# Patient Record
Sex: Male | Born: 1955 | Race: Black or African American | Hispanic: No | Marital: Single | Smoking: Never smoker
Health system: Southern US, Community
[De-identification: ages and names within clinical notes are randomized; demographics above are authoritative.]

## PROBLEM LIST (undated history)

## (undated) DIAGNOSIS — E785 Hyperlipidemia, unspecified: Secondary | ICD-10-CM

## (undated) DIAGNOSIS — E119 Type 2 diabetes mellitus without complications: Secondary | ICD-10-CM

## (undated) DIAGNOSIS — K219 Gastro-esophageal reflux disease without esophagitis: Secondary | ICD-10-CM

## (undated) DIAGNOSIS — M109 Gout, unspecified: Secondary | ICD-10-CM

## (undated) DIAGNOSIS — I10 Essential (primary) hypertension: Secondary | ICD-10-CM

## (undated) DIAGNOSIS — E669 Obesity, unspecified: Secondary | ICD-10-CM

---

## 2003-12-26 ENCOUNTER — Emergency Department (HOSPITAL_COMMUNITY): Admission: EM | Admit: 2003-12-26 | Discharge: 2003-12-26 | Payer: Self-pay | Admitting: Family Medicine

## 2005-11-29 ENCOUNTER — Emergency Department (HOSPITAL_COMMUNITY): Admission: EM | Admit: 2005-11-29 | Discharge: 2005-11-29 | Payer: Self-pay | Admitting: Emergency Medicine

## 2005-11-30 ENCOUNTER — Inpatient Hospital Stay (HOSPITAL_COMMUNITY): Admission: EM | Admit: 2005-11-30 | Discharge: 2005-12-01 | Payer: Self-pay | Admitting: Internal Medicine

## 2005-12-22 ENCOUNTER — Encounter (HOSPITAL_COMMUNITY): Admission: RE | Admit: 2005-12-22 | Discharge: 2006-03-22 | Payer: Self-pay | Admitting: *Deleted

## 2006-03-23 ENCOUNTER — Encounter (HOSPITAL_COMMUNITY): Admission: RE | Admit: 2006-03-23 | Discharge: 2006-06-21 | Payer: Self-pay | Admitting: *Deleted

## 2006-08-19 ENCOUNTER — Emergency Department (HOSPITAL_COMMUNITY): Admission: EM | Admit: 2006-08-19 | Discharge: 2006-08-19 | Payer: Self-pay | Admitting: Emergency Medicine

## 2006-09-08 ENCOUNTER — Emergency Department (HOSPITAL_COMMUNITY): Admission: EM | Admit: 2006-09-08 | Discharge: 2006-09-08 | Payer: Self-pay | Admitting: Emergency Medicine

## 2007-10-12 ENCOUNTER — Emergency Department (HOSPITAL_COMMUNITY): Admission: EM | Admit: 2007-10-12 | Discharge: 2007-10-12 | Payer: Self-pay | Admitting: Emergency Medicine

## 2008-11-19 ENCOUNTER — Emergency Department (HOSPITAL_COMMUNITY): Admission: EM | Admit: 2008-11-19 | Discharge: 2008-11-19 | Payer: Self-pay | Admitting: Emergency Medicine

## 2010-02-09 ENCOUNTER — Emergency Department (HOSPITAL_COMMUNITY): Admission: EM | Admit: 2010-02-09 | Discharge: 2010-02-09 | Payer: Self-pay | Admitting: Family Medicine

## 2011-01-19 LAB — DIFFERENTIAL
Basophils Absolute: 0 10*3/uL (ref 0.0–0.1)
Basophils Relative: 0 % (ref 0–1)
Eosinophils Absolute: 0.1 10*3/uL (ref 0.0–0.7)
Monocytes Relative: 6 % (ref 3–12)
Neutro Abs: 6.8 10*3/uL (ref 1.7–7.7)
Neutrophils Relative %: 71 % (ref 43–77)

## 2011-01-19 LAB — POCT I-STAT, CHEM 8
BUN: 13 mg/dL (ref 6–23)
Calcium, Ion: 1.23 mmol/L (ref 1.12–1.32)
Creatinine, Ser: 1.3 mg/dL (ref 0.4–1.5)
Hemoglobin: 13.9 g/dL (ref 13.0–17.0)
Sodium: 140 mEq/L (ref 135–145)
TCO2: 30 mmol/L (ref 0–100)

## 2011-01-19 LAB — CBC
MCHC: 33.2 g/dL (ref 30.0–36.0)
MCV: 93.4 fL (ref 78.0–100.0)
Platelets: 242 10*3/uL (ref 150–400)
RBC: 4.13 MIL/uL — ABNORMAL LOW (ref 4.22–5.81)

## 2011-01-19 LAB — URIC ACID: Uric Acid, Serum: 7.5 mg/dL (ref 4.0–7.8)

## 2011-03-18 NOTE — Cardiovascular Report (Signed)
NAME:  Wayne Wood, MOLDEN NO.:  [id number]   MEDICAL RECORD NO.:  [id number]          PATIENT TYPE:  INP   LOCATION:  3799                         FACILITY:  [facility]   PHYSICIAN:  Vesta Mixer, M.D. DATE OF BIRTH:  [birth year]   DATE OF PROCEDURE:  11/30/2005  DATE OF DISCHARGE:                              CARDIAC CATHETERIZATION   [name] is a 55 year old gentleman who presented with episodes of chest  pain. Dr. Reyes Ivan performed a diagnostic heart catheterization this morning  and found him to have a 95% stenosis of his mid right coronary artery. He  also has a moderate stenosis in the left anterior descending artery after  the insertion of a saphenous vein graft. He is referred now for PTCA and  stenting of the mid right coronary artery.   The patient received 5,200 units of heparin followed by an additional 2,000  units of heparin. He also received a double-bolus Integrilin drip. The right  coronary artery was cannulated using a Judkins right 4 side hole guide (6  [place]). The starting ACT was 234.   The right coronary artery was wired using a BMW guide wire. A 2.5 mm x 12 mm  Quantum Maverick was positioned down in the right coronary artery mid  lesion. It was inflated up to 6 atmospheres for 20 seconds. This resulted in  improvement of the vessel lumen, and there is a residual stenosis of  approximately 50%.   At this point a 2.75 x 16 mm Taxus stent was positioned in the mid RCA  lesion. It was deployed at 16 atmospheres for 40 seconds. Post-stent  dilatation was achieved using a 3.25 x 12 mm Quantum Maverick. We positioned  the balloon in the middle of the stent and inflated it up to 12 atmospheres  for 25 seconds. It was then placed distally and inflated up to 12  atmospheres for 25 seconds. It was then pulled back more proximally and  inflated up to 14 atmospheres for 25 seconds. This resulted in a very nice  angiographic result. The 95% stenosis had  reduced down to 0% stenosis. He  had no evidence of edge dissection. The patient is stable leaving the cath  lab. He will need to remain on Plavix for least a year or two. He has also  been advised to stop smoking. He will follow up with Dr. Reyes Ivan.           ______________________________  Vesta Mixer, M.D.     PJN/MEDQ  D:  11/30/2005  T:  11/30/2005  Job:  [id number]

## 2011-03-18 NOTE — H&P (Signed)
NAME:  Wayne Wood, Wayne Wood NO.:  [id number]   MEDICAL RECORD NO.:  [id number]          PATIENT TYPE:  EMS   LOCATION:  ED                           FACILITY:  [facility]   PHYSICIAN:  Hettie Holstein, D.O.    DATE OF BIRTH:  1956/01/11   DATE OF ADMISSION:  11/29/2005  DATE OF DISCHARGE:                                HISTORY & PHYSICAL   PRIMARY CARE PHYSICIAN:  Unassigned.  His primary health care is coordinated  at the [facility].   CHIEF COMPLAINT:  Chest pain and nausea.   HISTORY OF PRESENT ILLNESS:  Mr. Wayne Wood is a very pleasant 55 year old  African-American male with known history of coronary artery disease.  He had  an MI in 2001 with two-vessel coronary artery bypass grafting performed at  [place].  In any event, he had been in his usual state of health up  to about 1 to 2 weeks ago when he began to develop shortness of breath with  exertion that has progressed.  Today he said this was profound.  He  typically walks about a mild and 1/2 daily.  Today he had to stop about 3  times with severe shortness of breath and tightness in his chest.   He is a one-pack-per-day smoker.  He has a history of substance abuse with  narcotics but has been abstinent for 3 years now, attending narcotics  anonymous regularly.  In any event, in the emergency department, his point-  of-care markers were negative.  EKG was negative as well, and his pain was  improved.   Contacted cardiologist here to assess him during this hospitalization and  generally will need a further risk evaluation if he rules out as symptoms  sound concerning for unstable angina.   PAST MEDICAL HISTORY:  As noted above and significant for:  1.  Coronary artery disease status post coronary artery bypass grafting x2      vessels at [place].  2.  History of mid epigastric hernia and repair in the 1980.  3.  He had a colonoscopy this past Christmas and was supposed to follow up      for an  EGD following this but has not had this performed.  This was an      evaluation for previous history of occult blood in his stool.  He states      he has not had any melena, coffee ground emesis, or bright red blood per      rectum.   MEDICATIONS:  1.  Omeprazole 20 mg daily.  2.  Aspirin 81 mg daily.  3.  Multivitamins daily.  4.  Metoprolol 25 mg twice daily.  5.  Simvastatin 40 mg daily.  6.  Lisinopril 10 mg daily.  7.  Flunisolide nasal solution 2 sprays each nostril twice daily.  8.  Nitro-Dur 0.4 mg patch daily, off at bedtime.  9.  Nitroglycerin tablets p.r.n.   ALLERGIES:  No known drug allergies.   SOCIAL HISTORY:  The patient is a one-pack-per-day smoker.  No alcohol, no  drugs.  He  has remained abstinent for 3 years.  He lives alone and is not  married.  He has children in [place], [place] as well as [place].  He is a retired Cytogeneticist.   FAMILY HISTORY:  His mother is alive and well at age 37 with  hypercholesterolemia.  His father is in his 63s with diabetes mellitus.  He  currently is on hemodialysis.   REVIEW OF SYSTEMS:  The patient reports dyspnea on exertion.  He reports  some early satiety, but he denies any weight loss.  No nausea, vomiting,  diarrhea, or blood in stool.  Further Review of Systems unremarkable.   PHYSICAL EXAMINATION:  VITAL SIGNS:  Stable.  Blood pressure 118/76,  temperature 98.2, heart rate 67, respirations 16, O2 saturation 98%.  GENERAL:  The patient is alert.  His speech is a bit difficult to follow,  but he does not exhibit any slurred speech.  HEENT:  Head is normocephalic and atraumatic.  Extraocular muscles intact.  NECK: Supple. Nontender.  No thyromegaly or mass.  CARDIOVASCULAR: Normal S1 and S2 without S3 or S4.  LUNGS: Clear with some scattered wheezes throughout.  He exhibits normal  effort.  There is no dullness to percussion.  ABDOMEN: Slightly distended.  Bowel sounds are normoactive.  There is no  rigidity,  rebound, guarding, or hepatosplenomegaly.  EXTREMITIES:  No edema. Peripheral pulses are symmetrical bilaterally.  NEUROLOGIC:  Reveals him to be euthymic.  His affect is stable.  There are  no focal neurologic deficits on examination.   LABORATORY DATA:  Sodium 138, potassium 4.4, BUN 8, creatinine 1.4, glucose  124, AST 21, ALT 17, albumin 3.4.  INR 1.  WBC 10.7, hemoglobin 13, platelet  count 258, MCV 94.   EKG reveals normal sinus rhythm with first degree A-V block, septal wall  infarct, age indeterminate.   BNP 56.  INR 1.  Point-of-care was negative.   Chest x-ray was unremarkable.   ASSESSMENT:  1.  Chest pain with known history of coronary artery disease.  2.  Suspect unstable angina.  3.  Hypertension.  4.  Tobacco dependence.  5.  Renal insufficiency, chronic.   PLAN:  At this time, we are going to admit Mr. Wayne Wood to a telemetry floor.  We have consulted Dr. Reyes Ivan to follow. Will cycle cardiac markers, initiate  low-molecular-weight heparin.  Will follow his clinical course.     Hettie Holstein, D.O.  Electronically Signed    ESS/MEDQ  D:  11/29/2005  T:  11/29/2005  Job:  [id number]   cc:   Renne Musca

## 2011-03-18 NOTE — Discharge Summary (Signed)
NAME:  Wayne Wood, Wayne Wood NO.:  [id number]   MEDICAL RECORD NO.:  [id number]          PATIENT TYPE:  INP   LOCATION:  6529                         FACILITY:  [facility]   PHYSICIAN:  Elmore Guise., M.D.DATE OF BIRTH:  October 16, 1956   DATE OF ADMISSION:  11/30/2005  DATE OF DISCHARGE:                                 DISCHARGE SUMMARY   DISCHARGE DIAGNOSES:  1.  Non-ST-elevation myocardial infarction status post percutaneous coronary      intervention of mid right coronary artery disease.  2.  History of coronary artery disease, status post sequential vein grafting      to the left anterior descending and diagonal back in 2001.  3.  Dyslipidemia.  4.  Hypertension.  5.  Tobacco dependence.   HISTORY OF PRESENT ILLNESS:  The patient is very pleasant 55 year old  African-American male admitted with non-ST elevation myocardial infarction.  He underwent cardiac catheterization on November 30, 2005.  His diagnostic  catheterization showed patent vein graft to his LAD and diagonal with 40-50%  mid LAD disease after that touchdown.  His circumflex had mid 50% prior to  bifurcation of two large OM vessels.  His culprit vessel was his mid RCA  which had 95-99% mid occlusion. Following the diagnostic catheterization, he  underwent successful PCI of his mid RCA without complication. His LV  function was normal and LVEDP was 19 mmHg. His peak troponin was 3.4 during  his hospitalization.  His renal function and hemoglobin remained stable. His  cholesterol showed an LDL of 55 and HDL of 33. Total cholesterol was 115. He  has done well post procedure.  He has been up and ambulatory, has talked to  the smoking cessation counselor as well as cardiac rehab. He will be  discharged home today to continue following medications:  1.  Omeprazole 20 mg daily.  2.  Aspirin 81 mg daily.  3.  Multivitamin once daily.  4.  Metoprolol 25 mg b.i.d.  5.  Zocor 40 mg daily.  6.  Fosinopril 10 mg  daily.  7.  Tylenol p.r.n.  8.  Flunisolide nasal sprays 2 sprays each nostril b.i.d.  9.  Nitroglycerin p.r.n.  The patient can stop his nitroglycerin patch.  10. He is two new medications added to the above and they include Plavix 75      mg to take daily for at least the next two years and Niaspan 500 mg p.o.      nightly.   I have instructed him to take his aspirin 30 minutes prior to his Niaspan  dose. We did discuss that it may cause some flushing.   He will return for routine follow-up with Dr. Lady Deutscher at [facility] in one week. He is to notify the office should he have any  further problems. Phone number was given.      Elmore Guise., M.D.  Electronically Signed     TWK/MEDQ  D:  12/01/2005  T:  12/01/2005  Job:  [id number]

## 2011-03-18 NOTE — Cardiovascular Report (Signed)
NAME:  Wayne Wood, Wayne Wood NO.:  [id number]   MEDICAL RECORD NO.:  [id number]          PATIENT TYPE:  INP   LOCATION:  3799                         FACILITY:  [facility]   PHYSICIAN:  Elmore Guise., M.D.DATE OF BIRTH:  1956-04-04   DATE OF PROCEDURE:  11/30/2005  DATE OF DISCHARGE:                              CARDIAC CATHETERIZATION   INDICATION FOR PROCEDURE:  Unstable angina.   PROCEDURE DESCRIPTION:  The patient was brought to the cardiac cath lab  after appropriate informed consent.  He was prepped and draped in sterile  fashion. Approximately 10 cc of 1% lidocaine was used for local anesthesia.  A 6 French sheath was placed in the right femoral artery without difficulty.  Coronary angiography, LV angiography, vein graft angiography, and limited  nonselective left subclavian angiography were then performed. Sheath was  left in place for continued intervention of his mid-RCA.   FINDINGS:  1.  Left main: Long vessel with mild distal tapering.  2.  LAD: 100% proximal occlusion right at takeoff of first septal      perforator.  3.  LCX: Nondominant, with mid 40% stenosis prior to bifurcation of moderate      size OM1 and OM2 vessel.  4.  OM1 and OM2 vessel:  No significant disease.  5.  RCA: Dominant, with 95-99% mid stenosis, distal luminal irregularities.  6.  Vein graft to LAD and diagonal is patent. No significant disease in the      vein graft. There is mild tapering at the touchdown to the LAD portion.      The LAD after the touchdown has a mid 40% stenosis prior to a small      ectatic area with mid and distal luminal irregularities. Diagonal vessel      has no significant disease. Distal LAD system does give faint left-to-      right collaterals.  7.  Left subclavian was visualized to evaluate for the LIMA grafting.  Left      subclavian has no significant disease. 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  8.   LV: EF is 55-60%. No wall motion abnormalities. LVEDP was 19 mmHg.   IMPRESSION:  1.  Critical single-vessel obstructive right coronary artery disease with a      95-99% mid-right coronary artery stenosis.  2.  Patent vein graft sequentially to left anterior descending diagonal      system.  3.  Nonobstructive disease in the circumflex and mid and distal left      anterior descending after touchdown from his vein graft.   PLAN:  1.  Elective PCI of RCA.  2.  Aggressive medical therapy.  3.  Tobacco cessation.      Elmore Guise., M.D.  Electronically Signed     TWK/MEDQ  D:  11/30/2005  T:  11/30/2005  Job:  [id number]

## 2011-06-22 ENCOUNTER — Emergency Department (HOSPITAL_COMMUNITY): Payer: Medicare HMO

## 2011-06-22 ENCOUNTER — Inpatient Hospital Stay (HOSPITAL_COMMUNITY)
Admission: EM | Admit: 2011-06-22 | Discharge: 2011-06-27 | DRG: 728 | Disposition: A | Discharge status: Home / Self Care | Payer: Medicare HMO | Attending: Internal Medicine | Admitting: Internal Medicine

## 2011-06-22 DIAGNOSIS — Z951 Presence of aortocoronary bypass graft: Secondary | ICD-10-CM

## 2011-06-22 DIAGNOSIS — E119 Type 2 diabetes mellitus without complications: Secondary | ICD-10-CM | POA: Diagnosis present

## 2011-06-22 DIAGNOSIS — M109 Gout, unspecified: Secondary | ICD-10-CM | POA: Diagnosis present

## 2011-06-22 DIAGNOSIS — I2510 Atherosclerotic heart disease of native coronary artery without angina pectoris: Secondary | ICD-10-CM | POA: Diagnosis present

## 2011-06-22 DIAGNOSIS — N179 Acute kidney failure, unspecified: Secondary | ICD-10-CM | POA: Diagnosis present

## 2011-06-22 DIAGNOSIS — N498 Inflammatory disorders of other specified male genital organs: Principal | ICD-10-CM | POA: Diagnosis present

## 2011-06-22 DIAGNOSIS — M79609 Pain in unspecified limb: Secondary | ICD-10-CM | POA: Diagnosis present

## 2011-06-22 DIAGNOSIS — D631 Anemia in chronic kidney disease: Secondary | ICD-10-CM | POA: Diagnosis present

## 2011-06-22 DIAGNOSIS — N189 Chronic kidney disease, unspecified: Secondary | ICD-10-CM | POA: Diagnosis present

## 2011-06-22 DIAGNOSIS — E785 Hyperlipidemia, unspecified: Secondary | ICD-10-CM | POA: Diagnosis present

## 2011-06-22 DIAGNOSIS — I129 Hypertensive chronic kidney disease with stage 1 through stage 4 chronic kidney disease, or unspecified chronic kidney disease: Secondary | ICD-10-CM | POA: Diagnosis present

## 2011-06-22 LAB — DIFFERENTIAL
Basophils Absolute: 0 10*3/uL (ref 0.0–0.1)
Basophils Relative: 0 % (ref 0–1)
Eosinophils Absolute: 0.1 10*3/uL (ref 0.0–0.7)
Eosinophils Relative: 1 % (ref 0–5)
Monocytes Absolute: 1.3 10*3/uL — ABNORMAL HIGH (ref 0.1–1.0)
Monocytes Relative: 9 % (ref 3–12)
Neutro Abs: 10.7 10*3/uL — ABNORMAL HIGH (ref 1.7–7.7)

## 2011-06-22 LAB — BASIC METABOLIC PANEL
BUN: 17 mg/dL (ref 6–23)
Calcium: 10 mg/dL (ref 8.4–10.5)
GFR calc Af Amer: 58 mL/min — ABNORMAL LOW (ref >60)
GFR calc non Af Amer: 48 mL/min — ABNORMAL LOW (ref >60)
Glucose, Bld: 104 mg/dL — ABNORMAL HIGH (ref 70–99)
Potassium: 3.7 mEq/L (ref 3.5–5.1)
Sodium: 137 mEq/L (ref 135–145)

## 2011-06-22 LAB — URINALYSIS, ROUTINE W REFLEX MICROSCOPIC
Glucose, UA: NEGATIVE mg/dL
Nitrite: NEGATIVE
Protein, ur: NEGATIVE mg/dL
Urobilinogen, UA: 1 mg/dL (ref 0.0–1.0)

## 2011-06-22 LAB — CBC
Hemoglobin: 12.4 g/dL — ABNORMAL LOW (ref 13.0–17.0)
MCH: 30.5 pg (ref 26.0–34.0)
MCHC: 33.6 g/dL (ref 30.0–36.0)
RDW: 12.5 % (ref 11.5–15.5)

## 2011-06-22 LAB — URINE MICROSCOPIC-ADD ON

## 2011-06-23 LAB — HEMOGLOBIN A1C
Hgb A1c MFr Bld: 6.4 % — ABNORMAL HIGH (ref <5.7)
Mean Plasma Glucose: 137 mg/dL — ABNORMAL HIGH (ref <117)

## 2011-06-23 LAB — GLUCOSE, CAPILLARY: Glucose-Capillary: 98 mg/dL (ref 70–99)

## 2011-06-23 LAB — LACTIC ACID, PLASMA: Lactic Acid, Venous: 0.7 mmol/L (ref 0.5–2.2)

## 2011-06-23 LAB — IRON AND TIBC
Iron: 11 ug/dL — ABNORMAL LOW (ref 42–135)
UIBC: 226 ug/dL

## 2011-06-23 LAB — CBC
MCH: 30.7 pg (ref 26.0–34.0)
Platelets: 192 10*3/uL (ref 150–400)
RBC: 3.81 MIL/uL — ABNORMAL LOW (ref 4.22–5.81)
RDW: 12.7 % (ref 11.5–15.5)
WBC: 13.4 10*3/uL — ABNORMAL HIGH (ref 4.0–10.5)

## 2011-06-23 LAB — MAGNESIUM: Magnesium: 1.8 mg/dL (ref 1.5–2.5)

## 2011-06-23 LAB — FERRITIN: Ferritin: 241 ng/mL (ref 22–322)

## 2011-06-24 LAB — GLUCOSE, CAPILLARY
Glucose-Capillary: 102 mg/dL — ABNORMAL HIGH (ref 70–99)
Glucose-Capillary: 150 mg/dL — ABNORMAL HIGH (ref 70–99)

## 2011-06-24 LAB — COMPREHENSIVE METABOLIC PANEL
AST: 16 U/L (ref 0–37)
Albumin: 3.4 g/dL — ABNORMAL LOW (ref 3.5–5.2)
Alkaline Phosphatase: 52 U/L (ref 39–117)
CO2: 28 mEq/L (ref 19–32)
Chloride: 101 mEq/L (ref 96–112)
GFR calc Af Amer: >60 mL/min (ref >60)
Potassium: 4.1 mEq/L (ref 3.5–5.1)
Sodium: 139 mEq/L (ref 135–145)

## 2011-06-24 LAB — CBC
MCV: 90.2 fL (ref 78.0–100.0)
Platelets: 204 10*3/uL (ref 150–400)
RBC: 4.08 MIL/uL — ABNORMAL LOW (ref 4.22–5.81)
RDW: 12.5 % (ref 11.5–15.5)
WBC: 12.1 10*3/uL — ABNORMAL HIGH (ref 4.0–10.5)

## 2011-06-24 LAB — CK: Total CK: 190 U/L (ref 7–232)

## 2011-06-25 ENCOUNTER — Inpatient Hospital Stay (HOSPITAL_COMMUNITY): Payer: Medicare HMO

## 2011-06-25 LAB — GLUCOSE, CAPILLARY
Glucose-Capillary: 217 mg/dL — ABNORMAL HIGH (ref 70–99)
Glucose-Capillary: 180 mg/dL — ABNORMAL HIGH (ref 70–99)
Glucose-Capillary: 152 mg/dL — ABNORMAL HIGH (ref 70–99)

## 2011-06-25 LAB — CBC
MCH: 29.6 pg (ref 26.0–34.0)
MCHC: 32.9 g/dL (ref 30.0–36.0)
Platelets: 211 10*3/uL (ref 150–400)

## 2011-06-25 LAB — BASIC METABOLIC PANEL
BUN: 11 mg/dL (ref 6–23)
Calcium: 9.4 mg/dL (ref 8.4–10.5)
GFR calc non Af Amer: >60 mL/min (ref >60)
Glucose, Bld: 164 mg/dL — ABNORMAL HIGH (ref 70–99)
Potassium: 3.9 mEq/L (ref 3.5–5.1)

## 2011-06-25 NOTE — Consult Note (Signed)
NAME:  Wayne Wood, Wayne Wood NO.:  [id number]  MEDICAL RECORD NO.:  [id number]  LOCATION:  1436                         FACILITY:  [facility]  PHYSICIAN:  Heloise Purpura, MD      DATE OF BIRTH:  03-May-1956  DATE OF CONSULTATION:  06/23/2011 DATE OF DISCHARGE:                                CONSULTATION   REASON FOR CONSULTATION:  Scrotal cellulitis.  PHYSICIAN REQUESTING CONSULTATION:  Benson Setting, MD  HISTORY:  Wayne Wood is a 55 year old gentleman with a history of diabetes who began developing right-sided scrotal pain of moderate intensity approximately 3 days ago.  He has denied any associated symptoms including no fever, nausea/vomiting, or voiding/urinary complaints.  He also denies any history of trauma.  He has recently begun water aerobics and has been swimming in a public pool.  There are no other modifying factors.  PAST MEDICAL HISTORY: 1. Diabetes. 2. Hypertension. 3. Gastroesophageal reflux disease. 4. Coronary artery disease.  PAST SURGICAL HISTORY:  Coronary artery bypass grafting.  MEDICATIONS:  Current medications in the hospital include meropenem, aspirin, famotidine, insulin, subcu heparin, Crestor, vancomycin, Vicodin, and morphine.  ALLERGIES:  No known drug allergies.  FAMILY HISTORY:  No history of GU malignancy.  There is a family history of diabetes.  SOCIAL HISTORY:  He does have a distant history of recreational drug use, but none recently.  REVIEW OF SYSTEMS:  A complete review of systems was performed. Pertinent positives are as included in the history of present illness.  PHYSICAL EXAMINATION:  VITAL SIGNS:  Temperature 98.9, blood pressure 110/70, heart rate 89. CONSTITUTIONAL:  A well-nourished, well-developed, age-appropriate male, in no acute distress. HEENT:  Normocephalic, atraumatic. CARDIOVASCULAR:  Regular rate and rhythm. RESPIRATORY:  Normal respiratory effort. ABDOMEN:  Soft and nontender. BACK:  No CVA  tenderness. GU:  He does have some mild-to-moderate edema of the penile shaft toward the right base.  There is no fluctuance or induration of the penis.  He does have a distal hypospadias, but describes no difficulty voiding. His left testis is descended and palpably normal without any masses or tenderness.  His right testis is also palpably normal without masses or tenderness.  Toward the right posterior lateral aspect of the right hemiscrotum, there is noted to be a 2.5 x 3.5 cm indurated area, which is mildly tender and with very minimal erythema.  There is no fluctuance and there is no crepitus of the scrotal skin or perineum. DRE:  Normal sphincter tone.  No rectal masses.  No prostate tenderness. EXTREMITIES:  No edema. NEUROLOGIC:  Grossly intact.  LABORATORY DATA:  White blood count 14.4, blood glucose 124, creatinine 1.51.  Urinalysis is dipstick negative except for 0-2 white blood cells on microscopic exam.  RADIOLOGIC IMAGING:  I independently reviewed his scrotal ultrasound, which was performed earlier today.  This does not demonstrate any obvious scrotal abscess or fluid collection.  There is heterogeneity of the scrotal tissues likely consistent with a scrotal cellulitis.  He does have some increased blood flow through the epididymis particularly on the right side, but also on the left side possibly consistent with epididymitis, although clinically he does not appear to have epididymitis.  There is also what appears to be a left epididymal cyst.  IMPRESSION:  Scrotal cellulitis without evidence Fournier gangrene or scrotal abscess.  RECOMMENDATIONS:  I agree with broad-spectrum IV antibiotics and MRSA coverage at this time.  He will need serial exams, but there is currently no indication for surgical drainage or intervention.     Heloise Purpura, MD     LB/MEDQ  D:  06/23/2011  T:  06/23/2011  Job:  [id number]  cc:   Benson Setting, MD  Electronically Signed by  Heloise Purpura MD on 06/25/2011 06:45:16 PM

## 2011-06-26 LAB — GLUCOSE, CAPILLARY
Glucose-Capillary: 177 mg/dL — ABNORMAL HIGH (ref 70–99)
Glucose-Capillary: 97 mg/dL (ref 70–99)

## 2011-06-26 LAB — BASIC METABOLIC PANEL
BUN: 13 mg/dL (ref 6–23)
Calcium: 9.5 mg/dL (ref 8.4–10.5)
Chloride: 102 mEq/L (ref 96–112)
Creatinine, Ser: 1.04 mg/dL (ref 0.50–1.35)
GFR calc Af Amer: >60 mL/min (ref >60)
GFR calc Af Amer: >60 mL/min (ref >60)
GFR calc non Af Amer: >60 mL/min (ref >60)
Potassium: 4.7 mEq/L (ref 3.5–5.1)
Potassium: 4.1 mEq/L (ref 3.5–5.1)
Sodium: 137 mEq/L (ref 135–145)

## 2011-06-27 LAB — BASIC METABOLIC PANEL
Calcium: 9.1 mg/dL (ref 8.4–10.5)
Creatinine, Ser: 0.92 mg/dL (ref 0.50–1.35)
GFR calc non Af Amer: >60 mL/min (ref >60)
Sodium: 138 mEq/L (ref 135–145)

## 2011-06-27 LAB — GLUCOSE, CAPILLARY: Glucose-Capillary: 138 mg/dL — ABNORMAL HIGH (ref 70–99)

## 2011-06-27 LAB — CBC
MCH: 30.1 pg (ref 26.0–34.0)
MCHC: 33.7 g/dL (ref 30.0–36.0)
MCV: 89.1 fL (ref 78.0–100.0)
Platelets: 242 10*3/uL (ref 150–400)

## 2011-06-29 LAB — CULTURE, BLOOD (ROUTINE X 2)
Culture  Setup Time: 201208230336
Culture: NO GROWTH

## 2011-07-11 NOTE — Discharge Summary (Signed)
NAME:  Wayne Wood, Wayne Wood NO.:  [id number]  MEDICAL RECORD NO.:  [id number]  LOCATION:  1436                         FACILITY:  [facility]  PHYSICIAN:  Kathlen Mody, MD       DATE OF BIRTH:  1956/06/27  DATE OF ADMISSION:  06/22/2011 DATE OF DISCHARGE:  06/27/2011                              DISCHARGE SUMMARY   DISCHARGE DIAGNOSES: 1. Scrotal cellulitis. 2. Left elbow gout flare-up. 3. Anemia of chronic disease. 4. Type 2 diabetes. 5. Coronary artery disease, status post CABG. 6. Acute renal failure. 7. Leukocytosis.  DISCHARGE MEDICATIONS: 1. Colchicine 0.6 mg daily for 7 days. 2. Doxycycline 100 mg twice a day for 10 days. 3. Benazepril 5 mg 1 tablet daily. 4. Aspirin 81. 5. Metformin 500 mg twice daily. 6. Multivitamin 1 tablet daily. 7. Omeprazole 1 capsule daily. 8. Simvastatin 40 mg daily.  PERTINENT LABS:  The patient had urinalysis which showed trace leukocytes.  CBC significant for WBC count of 14, hemoglobin of 12. Basic metabolic panel significant for a creatinine of 1.5, glucose of 104.  Lactic acid was 0.7, procalcitonin was less than 0.1, hemoglobin A1c was 6.4, pro-BNP was 38.  Anemia panel showed ferritin of 241, hemoglobin A1c was 6.4.  Blood culture showed no growth after 5 days. On the day of discharge, basic metabolic panel showed normal creatinine, glucose of 137 and a CBC showed a hemoglobin of 11.3, normal WBC count.  DIAGNOSTIC STUDIES:  The patient had a two-view chest x-ray which showed no acute cardiopulmonary disease.  Ultrasound of the testicles and scrotum negative for testicular torsion.  Enlargement of epididymis bilaterally.  Findings are nonspecific.  Could represent acute or chronic epididymitis.  Ultrasound of the scrotum showed the same.  X-ray of the left elbow shows limited examination demonstrating an elbow effusion.  BRIEF HOSPITAL COURSE: 1. Scrotal cellulitis:  This is a 55 year old gentleman admitted for   scrotal pain and erythema and he was treated with IV antibiotics     including IV vancomycin.  Later on urology consult was obtained who     recommended continuing the antibiotics and suggested that there is     no evidence of Fournier's and there is no indication of post     surgical intervention at this time.  With the course of his     hospitalization, his scrotal erythema and tenderness have improved     and on the day of discharge urology recommended changing     antibiotics to p.o. and to follow with them as an outpatient in     about 2 weeks.  He was discharged on p.o. doxycycline for about 10     days. 2. Gout flare-up.  Over the course of his hospitalization, his left     elbow was swollen and he has a remote history of gout.  He was     started on indomethacin and colchicine.  His swelling and erythema     has resolved.  He was discharged on one more week of colchicine and     indomethacin for the gout flare-up. 3. Diabetes type 2:  Hemoglobin A1c was 6.1.  He was continued on  his     metformin. 4. Acute renal failure:  On admission,  his creatinine was 1.5.  He     ACE inhibitor, ARB and metformin were held.  Given adequate     hydration, his creatinine will normalize. 5. Anemia:  Anemia profile was obtained.  His ferritin was 241, most     likely anemia of chronic disease. 6. On the day of discharge, the patient's vitals were temperature of     97.2, pulse of 69, respirations 18, blood pressure 120/74,     saturating 97% on room air.  His exam was within normal limits.  He     was discharged to home to follow up with urology in about 2 weeks     and also PCP in about 1-2 weeks.          ______________________________ Kathlen Mody, MD     VA/MEDQ  D:  07/10/2011  T:  07/11/2011  Job:  [id number]  Electronically Signed by Kathlen Mody MD on 07/11/2011 02:07:33 PM

## 2011-08-08 LAB — CBC
Hemoglobin: 15.7
MCHC: 34.2
MCV: 92
RDW: 13.4

## 2011-08-08 LAB — COMPREHENSIVE METABOLIC PANEL
ALT: 29
Calcium: 9.1
Creatinine, Ser: 1.83 — ABNORMAL HIGH
GFR calc Af Amer: 47 — ABNORMAL LOW
GFR calc non Af Amer: 39 — ABNORMAL LOW
Glucose, Bld: 152 — ABNORMAL HIGH
Sodium: 132 — ABNORMAL LOW
Total Protein: 8.4 — ABNORMAL HIGH

## 2011-08-08 LAB — DIFFERENTIAL
Eosinophils Absolute: 0 — ABNORMAL LOW
Lymphocytes Relative: 15
Lymphs Abs: 2.4
Monocytes Relative: 8
Neutro Abs: 12.4 — ABNORMAL HIGH
Neutrophils Relative %: 77

## 2011-08-08 LAB — OCCULT BLOOD X 1 CARD TO LAB, STOOL: Fecal Occult Bld: POSITIVE

## 2011-08-08 LAB — PROTIME-INR
INR: 1
Prothrombin Time: 13.7

## 2011-08-08 NOTE — H&P (Signed)
NAME:  Wayne Wood, TILL NO.:  [id number]  MEDICAL RECORD NO.:  [id number]  LOCATION:  [facility]                         FACILITY:  [facility]  PHYSICIAN:  Benson Setting, MD    DATE OF BIRTH:  [birth year]  DATE OF ADMISSION:  06/22/2011 DATE OF DISCHARGE:                             HISTORY & PHYSICAL   REASON FOR HOSPITAL VISIT:  Scrotal pain.  HISTORY OF PRESENT ILLNESS:  This is a 55 year old African American gentleman with known past medical and surgical history of: 1. Diabetes mellitus type 2.  The patient on metformin. 2. 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 3. Remote history of smoking and recreational drug use, has not done     any in the last several years. 4. History of hypertension. 5. History of dyslipidemia. 6. History of GERD.  This is a pleasant 55 year old African American gentleman with above past medical and surgical problems, who presents to [name] with 3-day history of scrotal discomfort, he says that in fact he has been having some on and off scrotal discomfort for a couple of weeks but it got better, for the last 3 days he has noticed that it has become increasingly warm and red and tender, he also says that he had a swim in a pool about 2 days ago and he thinks it might have made it a little bit more worse.  He denies running any fever or chills.  Denies noticing any swollen glands in his thigh area.  Denies any discharge from his scrotal skin.  Denies any exposure to new sexual contacts.  Denies any sudden onset of scrotal pain.  Denies any injury to that area.  In the ER, he was known to have leukocytosis.  His examination was suggestive of scrotal cellulitis versus early Fournier gangrene and I was called to admit the patient.  In my interview, the patient denies any fever, chills.  Denies any headache, nausea, vomiting.  No new problems in vision or hearing.  No problem swallowing food or liquids.  No chest pain, palpitations,  cough, phlegm, shortness of breath.  No abdominal pain or discomfort.  Does have dull constant scrotal pain which is worse with movement, better with rest, some warmth and redness to that area.  Denies any skin rashes or bruises.  Denies any focal weakness, tingling, numbness of any extremity.  Denies any new mental stressors.  Not suicidal, homicidal.  Full 10-point review of systems was obtained.  Except as dictated above, all other review of systems is negative.  PAST MEDICAL AND SURGICAL HISTORY:  As above.  FAMILY HISTORY:  Positive for diabetes mellitus.  PERSONAL HISTORY:  For the last 2-3 years, no alcohol, smoking, or recreational drug use.  HOME MEDICATIONS:  Dosages of which is not known:  Aspirin one p.o. daily, benazepril one p.o. daily, metformin one p.o. b.i.d., multivitamin 1 pill p.o. daily, omeprazole one p.o. daily, simvastatin one p.o. daily.  ALLERGIES:  No known drug allergies.  PHYSICAL EXAMINATION:  VITAL SIGNS:  Temperature 99.4, pulse 88, respirations 20, blood pressure 145/82, he is 100% on room air. GENERAL:  African American gentleman lying in hospital bed, no apparent  discomfort. HEENT:  Normocephalic, atraumatic head.  Pupils equal and reactive to light.  Pink and moist tongue and throat.  No scleral icterus. NECK:  Supple.  No JVD. PSYCH:  Insight is intact.  He is alert, awake.  Not suicidal, homicidal. CNS:  All cranial nerves intact.  No focal neurological deficits. Strength 5/5 in all 4 extremities.  Sensation intact to all 4 extremities.  Downgoing plantars bilaterally. PULMONARY:  Symmetrical chest wall movement with good air movement bilaterally.  No rhonchi.  No wheezes. CVS:  Regular rate and rhythm.  Normal S1, S2.  No gallops, rubs, or murmurs.  Midline old CABG scar noted. ABDOMEN:  Soft, positive bowel sounds, nontender. GENITOURINARY:  Scrotal area is swollen.  He has a couple of areas of induration, but no fluctuation.  The  scrotal area is warm, red.  No tenderness on palpation, however, the patient states that if he walks or jumps, it hurts and he experiences a dull ache in that area. SKIN:  No other cyanosis or bruises. MUSCULOSKELETAL:  Muscle tone is normal.  No digital clubbing.  LATEST LABORATORY DATA:  White count 14.4, hemoglobin 12.4, hematocrit 36.9, platelets 215.  Sodium 137, potassium 3.7, chloride 99, bicarb 28, BUN 17, creatinine 1.5, glucose 104.  UA; trace leukocyte esterase positive, 2 wbc's.  Chest x-ray, no acute cardiopulmonary process.  EKG; sinus rhythm, 90 beats per minute, QTc 445 milliseconds.  No acute ST-T wave changes.  ASSESSMENT AND PLAN: 1. Scrotal cellulitis versus early Fournier gangrene.  I have     requested Dr. Lynelle Doctor to discuss the case with urologist on-call.  He     has discussed this case with Dr. Juanda Chance.  At this time, we will     obtain ultrasound of his scrotal area to rule out any abscess     collection.  I have ordered 2 sets of blood cultures along with IV     vancomycin and meropenem, 1 dose now, then pharmacy to dose.  The     patient will be seen by Urology.  ID consult can be considered in     the morning if not better.  We will also check a procalcitonin and     a lactic acid level.  We will monitor in step-down unit. 2. Diabetes mellitus type 2.  For now, we will withhold metformin.  We     will continue on low-dose sliding scale insulin and put the patient     on diabetic diet. 3. Coronary artery disease status post CABG.  Currently pain free.     Continue on aspirin for now.  Last blood pressure on the monitor     was 109/65.  At this time, I will hydrate him with fluids.  Once     blood pressure is better, low-dose beta-blocker can be considered. 4. Renal insufficiency.  Baseline creatinine appears to be 1.3 one     year ago.  For now, we will hold the patient's benazepril.  We will     hydrate him with IV fluids and check another BMP in the  morning.     We will also order urine electrolytes. 5. History of dyslipidemia.  We will put him on low-dose statin. 6. History of gastroesophageal reflux disease.  We will put the     patient on Zantac.  We will request pharmacy to     reconcile home medications and call the MD once it is done. 7. The patient is full code. 8.  Heparin for deep venous thrombosis prophylaxis, Zantac for peptic     ulcer disease prophylaxis.          ______________________________ Benson Setting, MD     PS/MEDQ  D:  06/22/2011  T:  06/22/2011  Job:  [id number]  Electronically Signed by Susa Raring MD on 08/08/2011 03:26:12 PM

## 2015-02-23 ENCOUNTER — Encounter (HOSPITAL_COMMUNITY): Payer: Self-pay | Admitting: Emergency Medicine

## 2015-02-23 ENCOUNTER — Emergency Department (INDEPENDENT_AMBULATORY_CARE_PROVIDER_SITE_OTHER)
Admission: EM | Admit: 2015-02-23 | Discharge: 2015-02-23 | Disposition: A | Discharge status: Home / Self Care | Payer: Commercial Managed Care - HMO | Source: Home / Self Care | Attending: Family Medicine | Admitting: Family Medicine

## 2015-02-23 DIAGNOSIS — M1009 Idiopathic gout, multiple sites: Secondary | ICD-10-CM

## 2015-02-23 DIAGNOSIS — M109 Gout, unspecified: Secondary | ICD-10-CM

## 2015-02-23 HISTORY — DX: Essential (primary) hypertension: I10

## 2015-02-23 HISTORY — DX: Type 2 diabetes mellitus without complications: E11.9

## 2015-02-23 MED ORDER — TRAMADOL HCL 50 MG PO TABS: 50.0000 mg | ORAL_TABLET | Freq: Four times a day (QID) | ORAL | Status: DC | PRN

## 2015-02-23 MED ORDER — DICLOFENAC SODIUM 50 MG PO TBEC
50.0000 mg | DELAYED_RELEASE_TABLET | Freq: Two times a day (BID) | ORAL | Status: DC
Start: 2015-02-23 — End: 2017-07-12

## 2015-02-23 MED ORDER — COLCHICINE 0.6 MG PO TABS
ORAL_TABLET | ORAL | Status: DC
Start: 2015-02-23 — End: 2021-08-02

## 2015-02-23 NOTE — ED Provider Notes (Signed)
CSN: [id number]     Arrival date & time 02/23/15  1456 History   First MD Initiated Contact with Patient 02/23/15 1610     Chief Complaint  Patient presents with  . Gout   (Consider location/radiation/quality/duration/timing/severity/associated sxs/prior Treatment) HPI Comments: 59 year old male with a history of gout presents today with right ankle pain stating that he is currently having another gout attack. His been almost a year since he has had his last one and it was in his right ankle at that time. She is complaining of tenderness, pain and swelling around the right ankle. Pain will radiate into the dorsum of the foot. Denies injury. Denies turning or twisting his ankle.   Past Medical History  Diagnosis Date  . Diabetes mellitus without complication   . Hypertension    No past surgical history on file. No family history on file. History  Substance Use Topics  . Smoking status: Never Smoker   . Smokeless tobacco: Not on file  . Alcohol Use: No    Review of Systems  Constitutional: Positive for activity change. Negative for fever.  HENT: Negative.   Respiratory: Negative.   Gastrointestinal: Negative.   Genitourinary: Negative.   Musculoskeletal: Positive for joint swelling and arthralgias. Negative for neck pain and neck stiffness.  Skin: Negative for rash and wound.    Allergies  Review of patient's allergies indicates no known allergies.  Home Medications   Prior to Admission medications   Medication Sig Start Date End Date Taking? Authorizing Provider  BENAZEPRIL HCL PO Take by mouth.   Yes Historical Provider, MD  metFORMIN (GLUCOPHAGE) 1000 MG tablet Take 1,000 mg by mouth 2 (two) times daily with a meal.   Yes Historical Provider, MD  Multiple Vitamin (MULTIVITAMIN) tablet Take 1 tablet by mouth daily.   Yes Historical Provider, MD  OMEPRAZOLE PO Take by mouth.   Yes Historical Provider, MD  simvastatin (ZOCOR) 80 MG tablet Take 80 mg by mouth daily.   Yes  Historical Provider, MD  colchicine 0.6 MG tablet Take 2 tabs now, then one tab daily. 02/23/15   Hayden Rasmussen, NP  diclofenac (VOLTAREN) 50 MG EC tablet Take 1 tablet (50 mg total) by mouth 2 (two) times daily. Take with food. 02/23/15   Hayden Rasmussen, NP  traMADol (ULTRAM) 50 MG tablet Take 1 tablet (50 mg total) by mouth every 6 (six) hours as needed. 02/23/15   Hayden Rasmussen, NP   BP 117/85 mmHg  Pulse 80  Temp(Src) 98.3 F (36.8 C) (Oral)  Resp 20  SpO2 97% Physical Exam  Constitutional: He is oriented to person, place, and time. He appears well-developed and well-nourished. No distress.  Neck: Normal range of motion. Neck supple.  Cardiovascular: Normal rate and regular rhythm.   Pulmonary/Chest: Effort normal. No respiratory distress.  Musculoskeletal: He exhibits edema and tenderness.  Mild swelling to the right lateral and medial ankle. Minimal erythema. Minimal swelling to the proximal foot dorsal aspect. Fair range of motion, limited due to pain. Tenderness to the soft tissue around the bony points of the ankle. No bony tenderness. No lymphangitis.  Neurological: He is alert and oriented to person, place, and time. He exhibits normal muscle tone.  Skin: Skin is warm and dry.  Psychiatric: He has a normal mood and affect.  Nursing note and vitals reviewed.   ED Course  Procedures (including critical care time) Labs Review Labs Reviewed - No data to display  Imaging Review No results found.   MDM  1. Gouty arthritis      Stop your simvistatin cholesterol medicine for 1 week while taking this medication. Colchicine Diclofenac Tramadol    Hayden Rasmussenavid Averill Pons, NP 02/23/15 [phone]

## 2015-02-23 NOTE — ED Notes (Signed)
C/o gout in right foot States foot is swollen Has loss balance due to swollen feet States he needs note for work

## 2015-02-23 NOTE — Discharge Instructions (Signed)
Gout Stop your simvistatin cholesterol medicine for 1 week while taking this medication. Gout is when your joints become red, sore, and swell (inflamed). This is caused by the buildup of uric acid crystals in the joints. Uric acid is a chemical that is normally in the blood. If the level of uric acid gets too high in the blood, these crystals form in your joints and tissues. Over time, these crystals can form into masses near the joints and tissues. These masses can destroy bone and cause the bone to look misshapen (deformed). HOME CARE   Do not take aspirin for pain.  Only take medicine as told by your doctor.  Rest the joint as much as you can. When in bed, keep sheets and blankets off painful areas.  Keep the sore joints raised (elevated).  Put warm or cold packs on painful joints. Use of warm or cold packs depends on which works best for you.  Use crutches if the painful joint is in your leg.  Drink enough fluids to keep your pee (urine) clear or pale yellow. Limit alcohol, sugary drinks, and drinks with fructose in them.  Follow your diet instructions. Pay careful attention to how much protein you eat. Include fruits, vegetables, whole grains, and fat-free or low-fat milk products in your daily diet. Talk to your doctor or dietitian about the use of coffee, vitamin C, and cherries. These may help lower uric acid levels.  Keep a healthy body weight. GET HELP RIGHT AWAY IF:   You have watery poop (diarrhea), throw up (vomit), or have any side effects from medicines.  You do not feel better in 24 hours, or you are getting worse.  Your joint becomes suddenly more tender, and you have chills or a fever. MAKE SURE YOU:   Understand these instructions.  Will watch your condition.  Will get help right away if you are not doing well or get worse. Document Released: 07/26/2008 Document Revised: 03/03/2014 Document Reviewed: 05/30/2012 [facility] Patient Information 2015 [place], [place].  This information is not intended to replace advice given to you by your health care provider. Make sure you discuss any questions you have with your health care provider.

## 2015-04-22 DIAGNOSIS — M25529 Pain in unspecified elbow: Secondary | ICD-10-CM | POA: Diagnosis not present

## 2015-10-14 DIAGNOSIS — I10 Essential (primary) hypertension: Secondary | ICD-10-CM | POA: Diagnosis not present

## 2015-10-14 DIAGNOSIS — I455 Other specified heart block: Secondary | ICD-10-CM | POA: Diagnosis not present

## 2015-10-14 DIAGNOSIS — K219 Gastro-esophageal reflux disease without esophagitis: Secondary | ICD-10-CM | POA: Diagnosis not present

## 2015-10-14 DIAGNOSIS — E669 Obesity, unspecified: Secondary | ICD-10-CM | POA: Diagnosis not present

## 2015-10-14 DIAGNOSIS — Z0000 Encounter for general adult medical examination without abnormal findings: Secondary | ICD-10-CM | POA: Diagnosis not present

## 2015-10-14 DIAGNOSIS — E784 Other hyperlipidemia: Secondary | ICD-10-CM | POA: Diagnosis not present

## 2015-10-14 DIAGNOSIS — Z6830 Body mass index (BMI) 30.0-30.9, adult: Secondary | ICD-10-CM | POA: Diagnosis not present

## 2015-10-14 DIAGNOSIS — E1169 Type 2 diabetes mellitus with other specified complication: Secondary | ICD-10-CM | POA: Diagnosis not present

## 2017-06-30 LAB — COLOGUARD: Cologuard: NEGATIVE

## 2017-07-12 ENCOUNTER — Encounter: Payer: Self-pay | Admitting: Family Medicine

## 2017-07-12 ENCOUNTER — Ambulatory Visit (INDEPENDENT_AMBULATORY_CARE_PROVIDER_SITE_OTHER): Payer: Medicare HMO | Admitting: Family Medicine

## 2017-07-12 VITALS — BP 138/82 | HR 81 | Temp 98.1°F | Wt 218.0 lb

## 2017-07-12 DIAGNOSIS — K219 Gastro-esophageal reflux disease without esophagitis: Secondary | ICD-10-CM | POA: Diagnosis not present

## 2017-07-12 DIAGNOSIS — E119 Type 2 diabetes mellitus without complications: Secondary | ICD-10-CM | POA: Diagnosis not present

## 2017-07-12 DIAGNOSIS — H6981 Other specified disorders of Eustachian tube, right ear: Secondary | ICD-10-CM | POA: Diagnosis not present

## 2017-07-12 DIAGNOSIS — M1A0790 Idiopathic chronic gout, unspecified ankle and foot, without tophus (tophi): Secondary | ICD-10-CM | POA: Diagnosis not present

## 2017-07-12 DIAGNOSIS — M722 Plantar fascial fibromatosis: Secondary | ICD-10-CM | POA: Insufficient documentation

## 2017-07-12 DIAGNOSIS — I509 Heart failure, unspecified: Secondary | ICD-10-CM | POA: Insufficient documentation

## 2017-07-12 DIAGNOSIS — Z23 Encounter for immunization: Secondary | ICD-10-CM

## 2017-07-12 DIAGNOSIS — Z7689 Persons encountering health services in other specified circumstances: Secondary | ICD-10-CM | POA: Diagnosis not present

## 2017-07-12 DIAGNOSIS — I10 Essential (primary) hypertension: Secondary | ICD-10-CM | POA: Insufficient documentation

## 2017-07-12 MED ORDER — FLUTICASONE PROPIONATE 50 MCG/ACT NA SUSP: 2.0000 | Freq: Every day | NASAL | 3 refills | Status: DC

## 2017-07-12 NOTE — Patient Instructions (Addendum)
Eustachian Tube Dysfunction The eustachian tube connects the middle ear to the back of the nose. It regulates air pressure in the middle ear by allowing air to move between the ear and nose. It also helps to drain fluid from the middle ear space. When the eustachian tube does not function properly, air pressure, fluid, or both can build up in the middle ear. Eustachian tube dysfunction can affect one or both ears. What are the causes? This condition happens when the eustachian tube becomes blocked or cannot open normally. This may result from:  Ear infections.  Colds and other upper respiratory infections.  Allergies.  Irritation, such as from cigarette smoke or acid from the stomach coming up into the esophagus (gastroesophageal reflux).  Sudden changes in air pressure, such as from descending in an airplane.  Abnormal growths in the nose or throat, such as nasal polyps, tumors, or enlarged tissue at the back of the throat (adenoids).  What increases the risk? This condition may be more likely to develop in people who smoke and people who are overweight. Eustachian tube dysfunction may also be more likely to develop in children, especially children who have:  Certain birth defects of the mouth, such as cleft palate.  Large tonsils and adenoids.  What are the signs or symptoms? Symptoms of this condition may include:  A feeling of fullness in the ear.  Ear pain.  Clicking or popping noises in the ear.  Ringing in the ear.  Hearing loss.  Loss of balance.  Symptoms may get worse when the air pressure around you changes, such as when you travel to an area of high elevation or fly on an airplane. How is this diagnosed? This condition may be diagnosed based on:  Your symptoms.  A physical exam of your ear, nose, and throat.  Tests, such as those that measure: ? The movement of your eardrum (tympanogram). ? Your hearing (audiometry).  How is this treated? Treatment  depends on the cause and severity of your condition. If your symptoms are mild, you may be able to relieve your symptoms by moving air into ("popping") your ears. If you have symptoms of fluid in your ears, treatment may include:  Decongestants.  Antihistamines.  Nasal sprays or ear drops that contain medicines that reduce swelling (steroids).  In some cases, you may need to have a procedure to drain the fluid in your eardrum (myringotomy). In this procedure, a small tube is placed in the eardrum to:  Drain the fluid.  Restore the air in the middle ear space.  Follow these instructions at home:  Take over-the-counter and prescription medicines only as told by your health care provider.  Use techniques to help pop your ears as recommended by your health care provider. These may include: ? Chewing gum. ? Yawning. ? Frequent, forceful swallowing. ? Closing your mouth, holding your nose closed, and gently blowing as if you are trying to blow air out of your nose.  Do not do any of the following until your health care provider approves: ? Travel to high altitudes. ? Fly in airplanes. ? Work in a pressurized cabin or room. ? Scuba dive.  Keep your ears dry. Dry your ears completely after showering or bathing.  Do not smoke.  Keep all follow-up visits as told by your health care provider. This is important. Contact a health care provider if:  Your symptoms do not go away after treatment.  Your symptoms come back after treatment.  You are   unable to pop your ears.  You have: ? A fever. ? Pain in your ear. ? Pain in your head or neck. ? Fluid draining from your ear.  Your hearing suddenly changes.  You become very dizzy.  You lose your balance. This information is not intended to replace advice given to you by your health care provider. Make sure you discuss any questions you have with your health care provider. Document Released: 11/13/2015 Document Revised: 03/24/2016  Document Reviewed: 11/05/2014 Elsevier Interactive Patient Education  2018 [employer]  DASH Eating Plan DASH stands for "Dietary Approaches to Stop Hypertension." The DASH eating plan is a healthy eating plan that has been shown to reduce high blood pressure (hypertension). It may also reduce your risk for type 2 diabetes, heart disease, and stroke. The DASH eating plan may also help with weight loss. What are tips for following this plan? General guidelines  Avoid eating more than 2,300 mg (milligrams) of salt (sodium) a day. If you have hypertension, you may need to reduce your sodium intake to 1,500 mg a day.  Limit alcohol intake to no more than 1 drink a day for nonpregnant women and 2 drinks a day for men. One drink equals 12 oz of beer, 5 oz of wine, or 1 oz of hard liquor.  Work with your health care provider to maintain a healthy body weight or to lose weight. Ask what an ideal weight is for you.  Get at least 30 minutes of exercise that causes your heart to beat faster (aerobic exercise) most days of the week. Activities may include walking, swimming, or biking.  Work with your health care provider or diet and nutrition specialist (dietitian) to adjust your eating plan to your individual calorie needs. Reading food labels  Check food labels for the amount of sodium per serving. Choose foods with less than 5 percent of the Daily Value of sodium. Generally, foods with less than 300 mg of sodium per serving fit into this eating plan.  To find whole grains, look for the word "whole" as the first word in the ingredient list. Shopping  Buy products labeled as "low-sodium" or "no salt added."  Buy fresh foods. Avoid canned foods and premade or frozen meals. Cooking  Avoid adding salt when cooking. Use salt-free seasonings or herbs instead of table salt or sea salt. Check with your health care provider or pharmacist before using salt substitutes.  Do not fry foods. Cook foods  using healthy methods such as baking, boiling, grilling, and broiling instead.  Cook with heart-healthy oils, such as olive, canola, soybean, or sunflower oil. Meal planning   Eat a balanced diet that includes: ? 5 or more servings of fruits and vegetables each day. At each meal, try to fill half of your plate with fruits and vegetables. ? Up to 6-8 servings of whole grains each day. ? Less than 6 oz of lean meat, poultry, or fish each day. A 3-oz serving of meat is about the same size as a deck of cards. One egg equals 1 oz. ? 2 servings of low-fat dairy each day. ? A serving of nuts, seeds, or beans 5 times each week. ? Heart-healthy fats. Healthy fats called Omega-3 fatty acids are found in foods such as flaxseeds and coldwater fish, like sardines, salmon, and mackerel.  Limit how much you eat of the following: ? Canned or prepackaged foods. ? Food that is high in trans fat, such as fried foods. ? Food that is  high in saturated fat, such as fatty meat. ? Sweets, desserts, sugary drinks, and other foods with added sugar. ? Full-fat dairy products.  Do not salt foods before eating.  Try to eat at least 2 vegetarian meals each week.  Eat more home-cooked food and less restaurant, buffet, and fast food.  When eating at a restaurant, ask that your food be prepared with less salt or no salt, if possible. What foods are recommended? The items listed may not be a complete list. Talk with your dietitian about what dietary choices are best for you. Grains Whole-grain or whole-wheat bread. Whole-grain or whole-wheat pasta. Brown rice. Orpah Cobb. Bulgur. Whole-grain and low-sodium cereals. Pita bread. Low-fat, low-sodium crackers. Whole-wheat flour tortillas. Vegetables Fresh or frozen vegetables (raw, steamed, roasted, or grilled). Low-sodium or reduced-sodium tomato and vegetable juice. Low-sodium or reduced-sodium tomato sauce and tomato paste. Low-sodium or reduced-sodium canned  vegetables. Fruits All fresh, dried, or frozen fruit. Canned fruit in natural juice (without added sugar). Meat and other protein foods Skinless chicken or [place]. Ground chicken or [place]. Pork with fat trimmed off. Fish and seafood. Egg whites. Dried beans, peas, or lentils. Unsalted nuts, nut butters, and seeds. Unsalted canned beans. Lean cuts of beef with fat trimmed off. Low-sodium, lean deli meat. Dairy Low-fat (1%) or fat-free (skim) milk. Fat-free, low-fat, or reduced-fat cheeses. Nonfat, low-sodium ricotta or cottage cheese. Low-fat or nonfat yogurt. Low-fat, low-sodium cheese. Fats and oils Soft margarine without trans fats. Vegetable oil. Low-fat, reduced-fat, or light mayonnaise and salad dressings (reduced-sodium). Canola, safflower, olive, soybean, and sunflower oils. Avocado. Seasoning and other foods Herbs. Spices. Seasoning mixes without salt. Unsalted popcorn and pretzels. Fat-free sweets. What foods are not recommended? The items listed may not be a complete list. Talk with your dietitian about what dietary choices are best for you. Grains Baked goods made with fat, such as croissants, muffins, or some breads. Dry pasta or rice meal packs. Vegetables Creamed or fried vegetables. Vegetables in a cheese sauce. Regular canned vegetables (not low-sodium or reduced-sodium). Regular canned tomato sauce and paste (not low-sodium or reduced-sodium). Regular tomato and vegetable juice (not low-sodium or reduced-sodium). Rosita Fire. Olives. Fruits Canned fruit in a light or heavy syrup. Fried fruit. Fruit in cream or butter sauce. Meat and other protein foods Fatty cuts of meat. Ribs. Fried meat. Tomasa Blase. Sausage. Bologna and other processed lunch meats. Salami. Fatback. Hotdogs. Bratwurst. Salted nuts and seeds. Canned beans with added salt. Canned or smoked fish. Whole eggs or egg yolks. Chicken or [place] with skin. Dairy Whole or 2% milk, cream, and half-and-half. Whole or full-fat  cream cheese. Whole-fat or sweetened yogurt. Full-fat cheese. Nondairy creamers. Whipped toppings. Processed cheese and cheese spreads. Fats and oils Butter. Stick margarine. Lard. Shortening. Ghee. Bacon fat. Tropical oils, such as coconut, palm kernel, or palm oil. Seasoning and other foods Salted popcorn and pretzels. Onion salt, garlic salt, seasoned salt, table salt, and sea salt. Worcestershire sauce. Tartar sauce. Barbecue sauce. Teriyaki sauce. Soy sauce, including reduced-sodium. Steak sauce. Canned and packaged gravies. Fish sauce. Oyster sauce. Cocktail sauce. Horseradish that you find on the shelf. Ketchup. Mustard. Meat flavorings and tenderizers. Bouillon cubes. Hot sauce and Tabasco sauce. Premade or packaged marinades. Premade or packaged taco seasonings. Relishes. Regular salad dressings. Where to find more information:  National Heart, Lung, and Blood Institute: PopSteam.is  American Heart Association: www.heart.org Summary  The DASH eating plan is a healthy eating plan that has been shown to reduce high blood pressure (hypertension). It  may also reduce your risk for type 2 diabetes, heart disease, and stroke.  With the DASH eating plan, you should limit salt (sodium) intake to 2,300 mg a day. If you have hypertension, you may need to reduce your sodium intake to 1,500 mg a day.  When on the DASH eating plan, aim to eat more fresh fruits and vegetables, whole grains, lean proteins, low-fat dairy, and heart-healthy fats.  Work with your health care provider or diet and nutrition specialist (dietitian) to adjust your eating plan to your individual calorie needs. This information is not intended to replace advice given to you by your health care provider. Make sure you discuss any questions you have with your health care provider. Document Released: 10/06/2011 Document Revised: 10/10/2016 Document Reviewed: 10/10/2016 Elsevier Interactive Patient Education  2017 Elsevier  Inc.  Diabetes Mellitus and Exercise Exercising regularly is important for your overall health, especially when you have diabetes (diabetes mellitus). Exercising is not only about losing weight. It has many health benefits, such as increasing muscle strength and bone density and reducing body fat and stress. This leads to improved fitness, flexibility, and endurance, all of which result in better overall health. Exercise has additional benefits for people with diabetes, including:  Reducing appetite.  Helping to lower and control blood glucose.  Lowering blood pressure.  Helping to control amounts of fatty substances (lipids) in the blood, such as cholesterol and triglycerides.  Helping the body to respond better to insulin (improving insulin sensitivity).  Reducing how much insulin the body needs.  Decreasing the risk for heart disease by: ? Lowering cholesterol and triglyceride levels. ? Increasing the levels of good cholesterol. ? Lowering blood glucose levels.  What is my activity plan? Your health care provider or certified diabetes educator can help you make a plan for the type and frequency of exercise (activity plan) that works for you. Make sure that you:  Do at least 150 minutes of moderate-intensity or vigorous-intensity exercise each week. This could be brisk walking, biking, or water aerobics. ? Do stretching and strength exercises, such as yoga or weightlifting, at least 2 times a week. ? Spread out your activity over at least 3 days of the week.  Get some form of physical activity every day. ? Do not go more than 2 days in a row without some kind of physical activity. ? Avoid being inactive for more than 90 minutes at a time. Take frequent breaks to walk or stretch.  Choose a type of exercise or activity that you enjoy, and set realistic goals.  Start slowly, and gradually increase the intensity of your exercise over time.  What do I need to know about managing my  diabetes?  Check your blood glucose before and after exercising. ? If your blood glucose is higher than 240 mg/dL (96.0 mmol/L) before you exercise, check your urine for ketones. If you have ketones in your urine, do not exercise until your blood glucose returns to normal.  Know the symptoms of low blood glucose (hypoglycemia) and how to treat it. Your risk for hypoglycemia increases during and after exercise. Common symptoms of hypoglycemia can include: ? Hunger. ? Anxiety. ? Sweating and feeling clammy. ? Confusion. ? Dizziness or feeling light-headed. ? Increased heart rate or palpitations. ? Blurry vision. ? Tingling or numbness around the mouth, lips, or tongue. ? Tremors or shakes. ? Irritability.  Keep a rapid-acting carbohydrate snack available before, during, and after exercise to help prevent or treat hypoglycemia.  Avoid  injecting insulin into areas of the body that are going to be exercised. For example, avoid injecting insulin into: ? The arms, when playing tennis. ? The legs, when jogging.  Keep records of your exercise habits. Doing this can help you and your health care provider adjust your diabetes management plan as needed. Write down: ? Food that you eat before and after you exercise. ? Blood glucose levels before and after you exercise. ? The type and amount of exercise you have done. ? When your insulin is expected to peak, if you use insulin. Avoid exercising at times when your insulin is peaking.  When you start a new exercise or activity, work with your health care provider to make sure the activity is safe for you, and to adjust your insulin, medicines, or food intake as needed.  Drink plenty of water while you exercise to prevent dehydration or heat stroke. Drink enough fluid to keep your urine clear or pale yellow. This information is not intended to replace advice given to you by your health care provider. Make sure you discuss any questions you have with  your health care provider. Document Released: 01/07/2004 Document Revised: 05/06/2016 Document Reviewed: 03/28/2016 Elsevier Interactive Patient Education  2018 [employer].  Gout Gout is painful swelling that can happen in some of your joints. Gout is a type of arthritis. This condition is caused by having too much uric acid in your body. Uric acid is a chemical that is made when your body breaks down substances called purines. If your body has too much uric acid, sharp crystals can form and build up in your joints. This causes pain and swelling. Gout attacks can happen quickly and be very painful (acute gout). Over time, the attacks can affect more joints and happen more often (chronic gout). Follow these instructions at home: During a Gout Attack  If directed, put ice on the painful area: ? Put ice in a plastic bag. ? Place a towel between your skin and the bag. ? Leave the ice on for 20 minutes, 2-3 times a day.  Rest the joint as much as possible. If the joint is in your leg, you may be given crutches to use.  Raise (elevate) the painful joint above the level of your heart as often as you can.  Drink enough fluids to keep your pee (urine) clear or pale yellow.  Take over-the-counter and prescription medicines only as told by your doctor.  Do not drive or use heavy machinery while taking prescription pain medicine.  Follow instructions from your doctor about what you can or cannot eat and drink.  Return to your normal activities as told by your doctor. Ask your doctor what activities are safe for you. Avoiding Future Gout Attacks  Follow a low-purine diet as told by a specialist (dietitian) or your doctor. Avoid foods and drinks that have a lot of purines, such as: ? Liver. ? Kidney. ? Anchovies. ? Asparagus. ? Herring. ? Mushrooms ? Mussels. ? Beer.  Limit alcohol intake to no more than 1 drink a day for nonpregnant women and 2 drinks a day for men. One drink equals 12  oz of beer, 5 oz of wine, or 1 oz of hard liquor.  Stay at a healthy weight or lose weight if you are overweight. If you want to lose weight, talk with your doctor. It is important that you do not lose weight too fast.  Start or continue an exercise plan as told by your  doctor.  Drink enough fluids to keep your pee clear or pale yellow.  Take over-the-counter and prescription medicines only as told by your doctor.  Keep all follow-up visits as told by your doctor. This is important. Contact a doctor if:  You have another gout attack.  You still have symptoms of a gout attack after10 days of treatment.  You have problems (side effects) because of your medicines.  You have chills or a fever.  You have burning pain when you pee (urinate).  You have pain in your lower back or belly. Get help right away if:  You have very bad pain.  Your pain cannot be controlled.  You cannot pee. This information is not intended to replace advice given to you by your health care provider. Make sure you discuss any questions you have with your health care provider. Document Released: 07/26/2008 Document Revised: 03/24/2016 Document Reviewed: 07/30/2015 Elsevier Interactive Patient Education  2018 [employer].  Tips for Eating Away From Home If You Have Diabetes Controlling your level of blood glucose, also known as blood sugar, can be challenging. It can be even more difficult when you do not prepare your own meals. The following tips can help you manage your diabetes when you eat away from home. Planning ahead Plan ahead if you know you will be eating away from home:  Ask your health care provider how to time meals and medicine if you are taking insulin.  Make a list of restaurants near you that offer healthy choices. If they have a carry-out menu, take it home and plan what you will order ahead of time.  Look up the restaurant you want to eat at online. Many chain and fast-food restaurants  list nutritional information online. Use this information to choose the healthiest options and to calculate how many carbohydrates will be in your meal.  Use a carbohydrate-counting book or mobile app to look up the carbohydrate content and serving size of the foods you want to eat.  Become familiar with serving sizes and learn to recognize how many servings are in a portion. This will allow you to estimate how many carbohydrates you can eat.  Free foods A "free food" is any food or drink that has less than 5 g of carbohydrates per serving. Free foods include:  Many vegetables.  Hard boiled eggs.  Nuts or seeds.  Olives.  Cheeses.  Meats.  These types of foods make good appetizer choices and are often available at salad bars. Lemon juice, vinegar, or a low-calorie salad dressing of fewer than 20 calories per serving can be used as a "free" salad dressing. Choices to reduce carbohydrates  Substitute nonfat sweetened yogurt with a sugar-free yogurt. Yogurt made from soy milk may also be used, but you will still want a sugar-free or plain option to choose a lower carbohydrate amount.  Ask your server to take away the bread basket or chips from your table.  Order fresh fruit. A salad bar often offers fresh fruit choices. Avoid canned fruit because it is usually packed in sugar or syrup.  Order a salad, and eat it without dressing. Or, create a "free" salad dressing.  Ask for substitutions. For example, instead of [place] fries, request an order of a vegetable such as salad, green beans, or broccoli. Other tips  If you take insulin, take the insulin once your food arrives to your table. This will ensure your insulin and food are timed correctly.  Ask your server about the  portion size before your order, and ask for a take-out box if the portion has more servings than you should have. When your food comes, leave the amount you should have on the plate, and put the rest in the take-out  box.  Consider splitting an entree with someone and ordering a side salad. This information is not intended to replace advice given to you by your health care provider. Make sure you discuss any questions you have with your health care provider. Document Released: 10/17/2005 Document Revised: 03/24/2016 Document Reviewed: 01/14/2014 Elsevier Interactive Patient Education  2018 [employer].  Tips for Eating Away From Home If You Have Diabetes Controlling your level of blood glucose, also known as blood sugar, can be challenging. It can be even more difficult when you do not prepare your own meals. The following tips can help you manage your diabetes when you eat away from home. Planning ahead Plan ahead if you know you will be eating away from home:  Ask your health care provider how to time meals and medicine if you are taking insulin.  Make a list of restaurants near you that offer healthy choices. If they have a carry-out menu, take it home and plan what you will order ahead of time.  Look up the restaurant you want to eat at online. Many chain and fast-food restaurants list nutritional information online. Use this information to choose the healthiest options and to calculate how many carbohydrates will be in your meal.  Use a carbohydrate-counting book or mobile app to look up the carbohydrate content and serving size of the foods you want to eat.  Become familiar with serving sizes and learn to recognize how many servings are in a portion. This will allow you to estimate how many carbohydrates you can eat.  Free foods A "free food" is any food or drink that has less than 5 g of carbohydrates per serving. Free foods include:  Many vegetables.  Hard boiled eggs.  Nuts or seeds.  Olives.  Cheeses.  Meats.  These types of foods make good appetizer choices and are often available at salad bars. Lemon juice, vinegar, or a low-calorie salad dressing of fewer than 20 calories per  serving can be used as a "free" salad dressing. Choices to reduce carbohydrates  Substitute nonfat sweetened yogurt with a sugar-free yogurt. Yogurt made from soy milk may also be used, but you will still want a sugar-free or plain option to choose a lower carbohydrate amount.  Ask your server to take away the bread basket or chips from your table.  Order fresh fruit. A salad bar often offers fresh fruit choices. Avoid canned fruit because it is usually packed in sugar or syrup.  Order a salad, and eat it without dressing. Or, create a "free" salad dressing.  Ask for substitutions. For example, instead of [place] fries, request an order of a vegetable such as salad, green beans, or broccoli. Other tips  If you take insulin, take the insulin once your food arrives to your table. This will ensure your insulin and food are timed correctly.  Ask your server about the portion size before your order, and ask for a take-out box if the portion has more servings than you should have. When your food comes, leave the amount you should have on the plate, and put the rest in the take-out box.  Consider splitting an entree with someone and ordering a side salad. This information is not intended to replace advice given  to you by your health care provider. Make sure you discuss any questions you have with your health care provider. Document Released: 10/17/2005 Document Revised: 03/24/2016 Document Reviewed: 01/14/2014 Elsevier Interactive Patient Education  [employer].

## 2017-07-12 NOTE — Progress Notes (Signed)
Patient presents to clinic today to establish care and for R ear fullness.  SUBJECTIVE: PMH: Pt is a 61 yo AAM with pmh sig for CAD s/p bypass, CHF, gout, GERD, HTN, and DM II.  Pt is also seen by the [facility] in [place], [place].  Pt states he needs a physician that is closer for certain things.  DM II -pt on Metformin 1000 mg BID -had Hgb A1c done in July or August at [facility], but not sure what it was. -Is suppose to check fsbs at home, but unable to work the pen lancet/not sure if machine is working. -Has been drinking ~ Print production planner1gallon or Kool-Aid with splenda q 2 days, drinking soda, eating watermelon. -Has been trying to exercise.  CAD: -h/o double bypass -on simvastatin 80 mg daily -ASA 81 mg -no longer smoking.  Quit   HTN: -on benazepril 20mg  -denies HA, blurred vision, CP  Gout: -stable -occasional gout flare, takes Colchicine prn -notices an a/w certain foods.  GERD: -takes omeprazole 20 mg daily -denies current issues with heart burn.  R ear issue: -ear fullness off and on x last few wks -has tried to make ear "pop" by moving jaw and wiggling ear -denies allergy hx   PSurgHx: Tonsillectomy 1967   Allergies: NKDA, NKFA  SocialHx: [place] originally from [place] but moved to [place] to be near his mother's side of the family that live in [place].  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  Pt lives in an apt by himself.  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  Pt is a former smoker.  He started sneaking cigarettes at an early age.  He was smoking 1 pk-1.5 ppd for most of his life. Pt quit smoking several yrs ago.  FMHx: Mom- arthritis Maternal and paternal GPs- arthritis PGF: colon cancer.  Discussing this made pt sad though its been >6572yrs ago since death. Pt also has an adopted brother and a step sister, but they are close.  Health  Maintenance: Dental -- several yrs ago.  Has a partial denture [facility] -- a while ago Immunizations -- interested in flu vac  Past Medical History:  Diagnosis Date  . Diabetes mellitus without complication (HCC)   . Hypertension     No past surgical history on file.  Current Outpatient Prescriptions on File Prior to Visit  Medication Sig Dispense Refill  . colchicine 0.6 MG tablet Take 2 tabs now, then one tab daily. 8 tablet 0  . metFORMIN (GLUCOPHAGE) 1000 MG tablet Take 1,000 mg by mouth 2 (two) times daily with a meal.    . Multiple Vitamin (MULTIVITAMIN) tablet Take 1 tablet by mouth daily.    . simvastatin (ZOCOR) 80 MG tablet Take 80 mg by mouth daily.     No current facility-administered medications on file prior to visit.     No Known Allergies  No family history on file.  Social History   Social History  . Marital status: Single    Spouse name: N/A  . Number of children: N/A  . Years of education: N/A   Occupational History  . Not on file.   Social History Main Topics  . Smoking status: Never Smoker  . Smokeless tobacco: Never Used  . Alcohol use No  . Drug use: No     Comment: recovering  addict  x14 yrs  . Sexual activity: Not on file   Other Topics Concern  . Not on file   Social History Narrative  . No narrative on file    ROS  General: Denies fever, chills, night sweats, changes in weight, changes in appetite HEENT: Denies headaches, changes in vision, rhinorrhea, sore throat  + R ear discomfort/fullness CV: Denies CP, palpitations, SOB, orthopnea Pulm: Denies SOB, cough, wheezing GI: Denies abdominal pain, nausea, vomiting, diarrhea, constipation GU: Denies dysuria, hematuria, frequency, vaginal discharge Msk: Denies muscle cramps, joint pains Neuro: Denies weakness, numbness, tingling Skin: Denies rashes, bruising Psych: Denies depression, hallucinations   +anxiety about meeting new dr.   BP 138/82 (BP Location: Left Arm, Patient  Position: Sitting, Cuff Size: Normal)   Pulse 81   Temp 98.1 F (36.7 C) (Oral)   Wt 218 lb (98.9 kg)   SpO2 96%   Physical Exam  Gen. Pleasant, well developed, well-nourished, in NAD HEENT - [place]/AT, PERRL, no scleral icterus, no nasal drainage, Mild erythema of nasal turbinates, partial denture in place, pharynx without erythema or exudate. Neck: No JVD, no thyromegaly Lungs: no use of accessory muscles, no dullness to percussion, CTAB, no wheezes, rales or rhonchi Cardiovascular: RRR, No r/g/m, no peripheral edema Abdomen: BS present, soft, nontender,nondistended, no hepatosplenomegaly Musculoskeletal: No deformities, moves all four extremities, no cyanosis or clubbing, normal tone Neuro:  A&Ox3, CN II-XII intact, normal gait Skin:  Warm, dry, intact, no lesions.  Well healed scar on R forearm Psych: normal affect, slightly anxious about meeting a new physician/going to a new dr.'s office.  Calmed down by end of visit.   No results found for this or any previous visit (from the past 2160 hour(s)).  Assessment/Plan: Diabetes mellitus without complication (HCC) -Unsure of recent Hgb A1c value.  Will try to get records from [place]. -Educated on/reviewed lancet use. -continue Metformin  BID -Pt encouraged to check fsbs at home, decrease the amount of sweetened drinks and fruit he eats per day. -Diet and exercise encouraged. -f/u in 1-2 months  Essential hypertension -controlled -continue lifestyle changes -continue benazepril 20 mg daily. -monitor for cough.  Chronic gout of foot, unspecified cause, unspecified laterality -Discussed avoiding certain foods -Colchicine prn flare  Need for influenza vaccination  - Plan: Flu Vaccine QUAD 36+ mos IM  Eustachian tube dysfunction, right  -acute -given handout on  - Plan: fluticasone (FLONASE) 50 MCG/ACT nasal spray  Gastroesophageal reflux disease without esophagitis -continue Omeprazole -avoid triggers   Establish care  with a new physician -[facility] -Also using [facility] of [address].   F/u in 1-2 months for DM, HTN

## 2017-07-21 ENCOUNTER — Encounter: Payer: Self-pay | Admitting: Family Medicine

## 2017-08-29 DIAGNOSIS — H5210 Myopia, unspecified eye: Secondary | ICD-10-CM | POA: Diagnosis not present

## 2017-08-29 DIAGNOSIS — E109 Type 1 diabetes mellitus without complications: Secondary | ICD-10-CM | POA: Diagnosis not present

## 2017-08-29 DIAGNOSIS — I10 Essential (primary) hypertension: Secondary | ICD-10-CM | POA: Diagnosis not present

## 2017-09-28 DIAGNOSIS — Z0100 Encounter for examination of eyes and vision without abnormal findings: Secondary | ICD-10-CM | POA: Diagnosis not present

## 2018-07-10 DIAGNOSIS — E139 Other specified diabetes mellitus without complications: Secondary | ICD-10-CM | POA: Diagnosis not present

## 2018-07-10 DIAGNOSIS — I10 Essential (primary) hypertension: Secondary | ICD-10-CM | POA: Diagnosis not present

## 2018-07-10 DIAGNOSIS — H5213 Myopia, bilateral: Secondary | ICD-10-CM | POA: Diagnosis not present

## 2018-09-12 DIAGNOSIS — I10 Essential (primary) hypertension: Secondary | ICD-10-CM | POA: Diagnosis not present

## 2018-09-12 DIAGNOSIS — I509 Heart failure, unspecified: Secondary | ICD-10-CM | POA: Diagnosis not present

## 2018-09-12 DIAGNOSIS — E1159 Type 2 diabetes mellitus with other circulatory complications: Secondary | ICD-10-CM | POA: Diagnosis not present

## 2018-09-12 DIAGNOSIS — J449 Chronic obstructive pulmonary disease, unspecified: Secondary | ICD-10-CM | POA: Diagnosis not present

## 2018-09-12 DIAGNOSIS — E785 Hyperlipidemia, unspecified: Secondary | ICD-10-CM | POA: Diagnosis not present

## 2018-09-12 DIAGNOSIS — Z79899 Other long term (current) drug therapy: Secondary | ICD-10-CM | POA: Diagnosis not present

## 2020-03-26 ENCOUNTER — Ambulatory Visit: Payer: Medicare HMO | Admitting: Podiatry

## 2020-03-26 ENCOUNTER — Other Ambulatory Visit: Payer: Self-pay

## 2020-03-26 ENCOUNTER — Encounter: Payer: Self-pay | Admitting: Podiatry

## 2020-03-26 DIAGNOSIS — L600 Ingrowing nail: Secondary | ICD-10-CM

## 2020-03-26 MED ORDER — NEOMYCIN-POLYMYXIN-HC 3.5-10000-1 OT SOLN: OTIC | 0 refills | Status: DC

## 2020-03-26 NOTE — Patient Instructions (Signed)

## 2020-03-27 NOTE — Progress Notes (Signed)
Subjective:   Patient ID: Wayne Wood, male   DOB: 64 y.o.   MRN: [id number]   HPI Patient presents with an extremely damaged thickened big toenail left that is painful and presents with discolored nails with history of damage of this 40 years ago and has been dealing with it since then.  He does have diabetes under excellent control with last A1c 6.4 and patient does not smoke and is active   Review of Systems  All other systems reviewed and are negative.       Objective:  Physical Exam Vitals and nursing note reviewed.  Constitutional:      Appearance: He is well-developed.  Pulmonary:     Effort: Pulmonary effort is normal.  Musculoskeletal:        General: Normal range of motion.  Skin:    General: Skin is warm.  Neurological:     Mental Status: He is alert.     Neurovascular status intact muscle strength found to be adequate range of motion was within normal limits.  Patient has good digital perfusion well oriented x3 with a number of nails that have discoloration and severe thickness and damage to the left hallux nail secondary to injury of long-term duration.  It is painful when pressed in the dorsal direction.  Patient was noted to have good digital perfusion well oriented x3     Assessment:  Severe damage to the left hallux nail with pain along with discoloration of adjacent nailbeds     Plan:  H&P all conditions reviewed discussed.  I have recommended removal of this hallux nail I explained procedure risk and the fact it will be permanent.  Patient wants surgery and at this time I infiltrated the left hallux 60 mg like Marcaine mixture I under sterile conditions remove the hallux nail and exposed matrix applied phenol 5 applications 30 seconds followed by alcohol lavage sterile dressing.  Gave instructions on soaks and leave dressing on 24 hours but take it off earlier if any issues were to occur and encourage patient to call with questions concerns which may  arise

## 2020-12-15 ENCOUNTER — Encounter (HOSPITAL_COMMUNITY): Payer: Self-pay | Admitting: Emergency Medicine

## 2020-12-15 ENCOUNTER — Other Ambulatory Visit: Payer: Self-pay

## 2020-12-15 ENCOUNTER — Ambulatory Visit (HOSPITAL_COMMUNITY)
Admission: EM | Admit: 2020-12-15 | Discharge: 2020-12-15 | Disposition: A | Discharge status: Home / Self Care | Payer: Medicare HMO | Attending: Internal Medicine | Admitting: Internal Medicine

## 2020-12-15 DIAGNOSIS — M533 Sacrococcygeal disorders, not elsewhere classified: Secondary | ICD-10-CM

## 2020-12-15 MED ORDER — IBUPROFEN 600 MG PO TABS: 600.0000 mg | ORAL_TABLET | Freq: Four times a day (QID) | ORAL | 0 refills | Status: DC | PRN

## 2020-12-15 MED ORDER — TIZANIDINE HCL 4 MG PO TABS: 4.0000 mg | ORAL_TABLET | Freq: Every evening | ORAL | 0 refills | Status: DC | PRN

## 2020-12-15 MED ORDER — HYDROCODONE-ACETAMINOPHEN 5-325 MG PO TABS
1.0000 | ORAL_TABLET | Freq: Four times a day (QID) | ORAL | 0 refills | Status: DC | PRN
Start: 2020-12-15 — End: 2021-07-26

## 2020-12-15 NOTE — ED Triage Notes (Signed)
Pt presents with low back pain xs 1 week. Denies any fall or injury. States has pain with laughing, sneezing, and coughing.

## 2020-12-15 NOTE — Discharge Instructions (Addendum)
Gentle range of motion exercises Stay away from from the gym over the next few days to allow back pain to improve Start going surgery next week and start off with gentle exercises Take medications as prescribed If you have worsening pain, extremity weakness or persistent numbness in the lower extremities please return to the urgent care to be reevaluated.

## 2020-12-15 NOTE — ED Provider Notes (Signed)
[facility]    CSN: [id number] Arrival date & time: 12/15/20  1420      History   Chief Complaint Chief Complaint  Patient presents with  . Back Pain    HPI Wayne Wood is a 65 y.o. male comes to the urgent care with complaint of low back pain that started about a week ago.  Patient went to the gym and was involved in several exercise routines.  Following that the patient started having sharp positional back pain.  Pain is of moderate severity.  Is aggravated by movement, laughing, sneezing coughing.  He has tried over-the-counter ibuprofen with no improvement in symptoms.  No radiation of pain.  No numbness or tingling in the lower extremities.  No difficulty with bowel movement.  No dysuria urgency or frequency.  No straining with micturition.Marland Kitchen   HPI  Past Medical History:  Diagnosis Date  . Diabetes mellitus without complication (HCC)   . Hypertension     Patient Active Problem List   Diagnosis Date Noted  . Chronic congestive heart failure (HCC) 07/12/2017  . Essential hypertension 07/12/2017  . Diabetes mellitus without complication (HCC) 07/12/2017  . Chronic gout of foot 07/12/2017  . Plantar fasciitis 07/12/2017    History reviewed. No pertinent surgical history.     Home Medications    Prior to Admission medications   Medication Sig Start Date End Date Taking? Authorizing Provider  HYDROcodone-acetaminophen (NORCO/VICODIN) 5-325 MG tablet Take 1 tablet by mouth every 6 (six) hours as needed for severe pain. 12/15/20  Yes Brylei Pedley, Britta Mccreedy, MD  ibuprofen (ADVIL) 600 MG tablet Take 1 tablet (600 mg total) by mouth every 6 (six) hours as needed. 12/15/20  Yes Saramarie Stinger, Britta Mccreedy, MD  tiZANidine (ZANAFLEX) 4 MG tablet Take 1 tablet (4 mg total) by mouth at bedtime as needed for muscle spasms. 12/15/20  Yes Tiye Huwe, Britta Mccreedy, MD  albuterol (VENTOLIN HFA) 108 [phone] Base) MCG/ACT inhaler  11/09/19   [provider]  allopurinol (ZYLOPRIM) 100 MG  tablet  10/09/19   [provider]  colchicine 0.6 MG tablet Take 2 tabs now, then one tab daily. 02/23/15   Hayden Rasmussen, NP  glimepiride (AMARYL) 2 MG tablet  11/20/19   [provider]  latanoprost (XALATAN) 0.005 % ophthalmic solution  01/12/20   [provider]  lisinopril (ZESTRIL) 20 MG tablet  03/09/20   [provider]  Multiple Vitamin (MULTIVITAMIN) tablet Take 1 tablet by mouth daily.    [provider]  neomycin-polymyxin-hydrocortisone (CORTISPORIN) OTIC solution Apply 1-2 drops to toe after soaking twice a day 03/26/20   Lenn Sink, DPM  omeprazole (PRILOSEC) 20 MG capsule Take 1 capsule by mouth daily. 06/01/17   [provider]  simvastatin (ZOCOR) 80 MG tablet Take 80 mg by mouth daily.    [provider]  tamsulosin (FLOMAX) 0.4 MG CAPS capsule  03/09/20   [provider]    Family History History reviewed. No pertinent family history.  Social History Social History   Tobacco Use  . Smoking status: Never Smoker  . Smokeless tobacco: Never Used  Substance Use Topics  . Alcohol use: No  . Drug use: No    Comment: recovering addict  x14 yrs     Allergies   Patient has no known allergies.   Review of Systems Review of Systems  Gastrointestinal: Negative.   Genitourinary: Negative for dysuria, frequency, hematuria and urgency.  Musculoskeletal: Positive for back pain. Negative  for arthralgias, joint swelling, myalgias, neck pain and neck stiffness.     Physical Exam Triage Vital Signs ED Triage Vitals  Enc Vitals Group     BP 12/15/20 1514 (!) 146/77     Pulse Rate 12/15/20 1514 84     Resp 12/15/20 1514 20     Temp 12/15/20 1514 98.3 F (36.8 C)     Temp Source 12/15/20 1514 Oral     SpO2 12/15/20 1514 98 %     Weight --      Height --      Head Circumference --      Peak Flow --      Pain Score 12/15/20 1512 8     Pain Loc --      Pain Edu? --      Excl. in GC? --    No data  found.  Updated Vital Signs BP (!) 146/77 (BP Location: Right Arm)   Pulse 84   Temp 98.3 F (36.8 C) (Oral)   Resp 20   SpO2 98%   Visual Acuity Right Eye Distance:   Left Eye Distance:   Bilateral Distance:    Right Eye Near:   Left Eye Near:    Bilateral Near:     Physical Exam Vitals and nursing note reviewed.  Cardiovascular:     Rate and Rhythm: Normal rate and regular rhythm.     Pulses: Normal pulses.     Heart sounds: Normal heart sounds.  Pulmonary:     Effort: Pulmonary effort is normal.     Breath sounds: Normal breath sounds.  Musculoskeletal:     Comments: Point tenderness on palpation over the left sacroiliac area.  No swelling or bruising.  Neurological:     Mental Status: He is alert.      UC Treatments / Results  Labs (all labs ordered are listed, but only abnormal results are displayed) Labs Reviewed - No data to display  EKG   Radiology No results found.  Procedures Procedures (including critical care time)  Medications Ordered in UC Medications - No data to display  Initial Impression / Assessment and Plan / UC Course  I have reviewed the triage vital signs and the nursing notes.  Pertinent labs & imaging results that were available during my care of the patient were reviewed by me and considered in my medical decision making (see chart for details).     1.  Acute sacroiliac pain: Gentle range of motion exercises Ibuprofen 600 mg every 6-8 hours Heating pad is recommended Zanaflex at bedtime as needed for muscle spasms Hydrocodone-acetaminophen as needed for pain . Return precautions given. Final Clinical Impressions(s) / UC Diagnoses   Final diagnoses:  Sacroiliac pain     Discharge Instructions     Gentle range of motion exercises Stay away from from the gym over the next few days to allow back pain to improve Start going surgery next week and start off with gentle exercises Take medications as prescribed If you  have worsening pain, extremity weakness or persistent numbness in the lower extremities please return to the urgent care to be reevaluated.   ED Prescriptions    Medication Sig Dispense Auth. Provider   ibuprofen (ADVIL) 600 MG tablet Take 1 tablet (600 mg total) by mouth every 6 (six) hours as needed. 30 tablet Mycal Conde, Britta Mccreedy, MD   tiZANidine (ZANAFLEX) 4 MG tablet Take 1 tablet (4 mg total) by mouth at bedtime as needed for muscle spasms.  30 tablet Christian Treadway, Britta Mccreedy, MD   HYDROcodone-acetaminophen (NORCO/VICODIN) 5-325 MG tablet Take 1 tablet by mouth every 6 (six) hours as needed for severe pain. 6 tablet Chalese Peach, Britta Mccreedy, MD     I have reviewed the PDMP during this encounter.   Merrilee Jansky, MD 12/15/20 [phone]

## 2021-07-24 ENCOUNTER — Other Ambulatory Visit: Payer: Self-pay

## 2021-07-24 ENCOUNTER — Inpatient Hospital Stay (HOSPITAL_COMMUNITY)
Admission: EM | Admit: 2021-07-24 | Discharge: 2021-08-02 | DRG: 100 | Disposition: A | Discharge status: Home Health | Payer: Medicare HMO | Attending: Internal Medicine | Admitting: Internal Medicine

## 2021-07-24 ENCOUNTER — Emergency Department (HOSPITAL_COMMUNITY): Payer: Medicare HMO

## 2021-07-24 ENCOUNTER — Encounter (HOSPITAL_COMMUNITY): Payer: Self-pay | Admitting: *Deleted

## 2021-07-24 DIAGNOSIS — M25572 Pain in left ankle and joints of left foot: Secondary | ICD-10-CM | POA: Diagnosis present

## 2021-07-24 DIAGNOSIS — R569 Unspecified convulsions: Secondary | ICD-10-CM

## 2021-07-24 DIAGNOSIS — E11649 Type 2 diabetes mellitus with hypoglycemia without coma: Secondary | ICD-10-CM | POA: Diagnosis present

## 2021-07-24 DIAGNOSIS — W19XXXA Unspecified fall, initial encounter: Secondary | ICD-10-CM | POA: Diagnosis present

## 2021-07-24 DIAGNOSIS — R4182 Altered mental status, unspecified: Secondary | ICD-10-CM

## 2021-07-24 DIAGNOSIS — R32 Unspecified urinary incontinence: Secondary | ICD-10-CM | POA: Diagnosis present

## 2021-07-24 DIAGNOSIS — E872 Acidosis, unspecified: Secondary | ICD-10-CM | POA: Diagnosis present

## 2021-07-24 DIAGNOSIS — E119 Type 2 diabetes mellitus without complications: Secondary | ICD-10-CM

## 2021-07-24 DIAGNOSIS — I10 Essential (primary) hypertension: Secondary | ICD-10-CM | POA: Diagnosis present

## 2021-07-24 DIAGNOSIS — Z20822 Contact with and (suspected) exposure to covid-19: Secondary | ICD-10-CM | POA: Diagnosis present

## 2021-07-24 DIAGNOSIS — IMO0002 Reserved for concepts with insufficient information to code with codable children: Secondary | ICD-10-CM

## 2021-07-24 DIAGNOSIS — G9341 Metabolic encephalopathy: Secondary | ICD-10-CM | POA: Diagnosis present

## 2021-07-24 DIAGNOSIS — E785 Hyperlipidemia, unspecified: Secondary | ICD-10-CM | POA: Diagnosis present

## 2021-07-24 DIAGNOSIS — M109 Gout, unspecified: Secondary | ICD-10-CM | POA: Diagnosis present

## 2021-07-24 DIAGNOSIS — Z87891 Personal history of nicotine dependence: Secondary | ICD-10-CM

## 2021-07-24 DIAGNOSIS — E860 Dehydration: Secondary | ICD-10-CM | POA: Diagnosis present

## 2021-07-24 DIAGNOSIS — N400 Enlarged prostate without lower urinary tract symptoms: Secondary | ICD-10-CM | POA: Diagnosis present

## 2021-07-24 DIAGNOSIS — E1165 Type 2 diabetes mellitus with hyperglycemia: Secondary | ICD-10-CM | POA: Diagnosis present

## 2021-07-24 DIAGNOSIS — Z79899 Other long term (current) drug therapy: Secondary | ICD-10-CM

## 2021-07-24 DIAGNOSIS — Y929 Unspecified place or not applicable: Secondary | ICD-10-CM

## 2021-07-24 DIAGNOSIS — E876 Hypokalemia: Secondary | ICD-10-CM | POA: Diagnosis present

## 2021-07-24 DIAGNOSIS — N179 Acute kidney failure, unspecified: Secondary | ICD-10-CM | POA: Diagnosis present

## 2021-07-24 DIAGNOSIS — Z6831 Body mass index (BMI) 31.0-31.9, adult: Secondary | ICD-10-CM

## 2021-07-24 DIAGNOSIS — G40409 Other generalized epilepsy and epileptic syndromes, not intractable, without status epilepticus: Principal | ICD-10-CM | POA: Diagnosis present

## 2021-07-24 DIAGNOSIS — M25579 Pain in unspecified ankle and joints of unspecified foot: Secondary | ICD-10-CM

## 2021-07-24 DIAGNOSIS — R509 Fever, unspecified: Secondary | ICD-10-CM

## 2021-07-24 DIAGNOSIS — K219 Gastro-esophageal reflux disease without esophagitis: Secondary | ICD-10-CM | POA: Diagnosis present

## 2021-07-24 DIAGNOSIS — E669 Obesity, unspecified: Secondary | ICD-10-CM | POA: Diagnosis present

## 2021-07-24 HISTORY — DX: Hyperlipidemia, unspecified: E78.5

## 2021-07-24 HISTORY — DX: Gastro-esophageal reflux disease without esophagitis: K21.9

## 2021-07-24 HISTORY — DX: Gout, unspecified: M10.9

## 2021-07-24 HISTORY — DX: Obesity, unspecified: E66.9

## 2021-07-24 LAB — CBC
HCT: 38.8 % — ABNORMAL LOW (ref 39.0–52.0)
Hemoglobin: 13 g/dL (ref 13.0–17.0)
MCH: 30.7 pg (ref 26.0–34.0)
MCHC: 33.5 g/dL (ref 30.0–36.0)
MCV: 91.5 fL (ref 80.0–100.0)
Platelets: 229 10*3/uL (ref 150–400)
RBC: 4.24 MIL/uL (ref 4.22–5.81)
RDW: 12.2 % (ref 11.5–15.5)
WBC: 10.7 10*3/uL — ABNORMAL HIGH (ref 4.0–10.5)
nRBC: 0 % (ref 0.0–0.2)

## 2021-07-24 LAB — MAGNESIUM: Magnesium: 2 mg/dL (ref 1.7–2.4)

## 2021-07-24 LAB — DIFFERENTIAL
Abs Immature Granulocytes: 0.03 10*3/uL (ref 0.00–0.07)
Basophils Absolute: 0.1 10*3/uL (ref 0.0–0.1)
Basophils Relative: 1 %
Eosinophils Absolute: 0.1 10*3/uL (ref 0.0–0.5)
Eosinophils Relative: 1 %
Immature Granulocytes: 0 %
Lymphocytes Relative: 25 %
Lymphs Abs: 2.7 10*3/uL (ref 0.7–4.0)
Monocytes Absolute: 0.7 10*3/uL (ref 0.1–1.0)
Monocytes Relative: 7 %
Neutro Abs: 7.2 10*3/uL (ref 1.7–7.7)
Neutrophils Relative %: 66 %

## 2021-07-24 LAB — COMPREHENSIVE METABOLIC PANEL
ALT: 22 U/L (ref 0–44)
AST: 27 U/L (ref 15–41)
Albumin: 3.4 g/dL — ABNORMAL LOW (ref 3.5–5.0)
Alkaline Phosphatase: 100 U/L (ref 38–126)
Anion gap: 18 — ABNORMAL HIGH (ref 5–15)
BUN: 13 mg/dL (ref 8–23)
CO2: 16 mmol/L — ABNORMAL LOW (ref 22–32)
Calcium: 9.4 mg/dL (ref 8.9–10.3)
Chloride: 98 mmol/L (ref 98–111)
Creatinine, Ser: 1.97 mg/dL — ABNORMAL HIGH (ref 0.61–1.24)
GFR, Estimated: 37 mL/min — ABNORMAL LOW (ref >60)
Glucose, Bld: 561 mg/dL (ref 70–99)
Potassium: 4.1 mmol/L (ref 3.5–5.1)
Sodium: 132 mmol/L — ABNORMAL LOW (ref 135–145)
Total Bilirubin: 0.6 mg/dL (ref 0.3–1.2)
Total Protein: 7.1 g/dL (ref 6.5–8.1)

## 2021-07-24 LAB — PROTIME-INR
INR: 1 (ref 0.8–1.2)
Prothrombin Time: 13.5 seconds (ref 11.4–15.2)

## 2021-07-24 LAB — ETHANOL: Alcohol, Ethyl (B): <10 mg/dL (ref <10)

## 2021-07-24 LAB — AMMONIA: Ammonia: 60 umol/L — ABNORMAL HIGH (ref 9–35)

## 2021-07-24 LAB — APTT: aPTT: 23 seconds — ABNORMAL LOW (ref 24–36)

## 2021-07-24 LAB — BETA-HYDROXYBUTYRIC ACID: Beta-Hydroxybutyric Acid: 0.15 mmol/L (ref 0.05–0.27)

## 2021-07-24 MED ORDER — LORAZEPAM 2 MG/ML IJ SOLN
INTRAMUSCULAR | Status: AC
  Administered 2021-07-24: 2 mg
  Filled 2021-07-24: qty 1

## 2021-07-24 MED ORDER — SODIUM CHLORIDE 0.9 % IV BOLUS
1000.0000 mL | Freq: Once | INTRAVENOUS | Status: AC
  Administered 2021-07-24: 1000 mL via INTRAVENOUS

## 2021-07-24 NOTE — ED Triage Notes (Signed)
Pt to ER by EMS after a neighbor called for help. Pt was found in his car by neighbor, then moved outside of the car after being instructed by dispatcher. Pt appeared postictal with EMS, incontinent of urine, oral trauma

## 2021-07-24 NOTE — ED Provider Notes (Addendum)
[facility] EMERGENCY DEPARTMENT Provider Note   CSN: [id number] Arrival date & time: 07/24/21  2223     History Chief Complaint  Patient presents with   Altered Mental Status   Seizures    Wayne Wood is a 65 y.o. male presents to the emergency department by EMS.  They were called out for unresponsive.  Patient was found in his vehicle by neighbors behind the wheel fully unresponsive.  Per EMS on their arrival GCS of 10.  They noted oral trauma.  Neighbors report no known history of seizures.  They report patient's mental status has improved throughout his time with them.  He was initially hypertensive with a systolic blood pressure slightly greater than 200 on their arrival.  Patient confused.  Answer some questions.  Knows his name that he is at the hospital.  Repetitively asking what is wrong with his body.  Records reviewed.  History of congestive heart failure, hypertension, diabetes.  LEVEL 5 CAVEAT for AMS.    The history is provided by the patient, medical records and the EMS personnel. No language interpreter was used.      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 No pertinent surgical history.     No family history on file.  Social History   Tobacco Use   Smoking status: Never   Smokeless tobacco: Never  Substance Use Topics   Alcohol use: No   Drug use: No    Comment: recovering addict  x14 yrs    Home Medications Prior to Admission medications   Medication Sig Start Date End Date Taking? Authorizing Provider  albuterol (VENTOLIN HFA) 108 (90 Base)  MCG/ACT inhaler  11/09/19   [provider]  allopurinol (ZYLOPRIM) 100 MG tablet  10/09/19   [provider]  colchicine 0.6 MG tablet Take 2 tabs now, then one tab daily. 02/23/15   Hayden Rasmussen, NP  glimepiride (AMARYL) 2 MG tablet  11/20/19   [provider]  HYDROcodone-acetaminophen (NORCO/VICODIN) 5-325 MG tablet Take 1 tablet by mouth every 6 (six) hours as needed for severe pain. 12/15/20   Merrilee Jansky, MD  ibuprofen (ADVIL) 600 MG tablet Take 1 tablet (600 mg total) by mouth every 6 (six) hours as needed. 12/15/20   Merrilee Jansky, MD  latanoprost (XALATAN) 0.005 % ophthalmic solution  01/12/20   [provider]  lisinopril (ZESTRIL) 20 MG tablet  03/09/20   [provider]  Multiple Vitamin (MULTIVITAMIN) tablet Take 1 tablet by mouth daily.    [provider]  neomycin-polymyxin-hydrocortisone (CORTISPORIN) OTIC solution Apply 1-2 drops to toe after soaking twice a day 03/26/20   Lenn Sink, DPM  omeprazole (PRILOSEC) 20 MG capsule Take 1 capsule by mouth daily. 06/01/17   [provider]  simvastatin (ZOCOR) 80 MG tablet Take 80 mg by mouth daily.    [provider]  tamsulosin (FLOMAX) 0.4 MG CAPS capsule  03/09/20   [provider]  tiZANidine (ZANAFLEX) 4 MG tablet Take 1 tablet (4 mg total) by mouth at bedtime as needed for muscle spasms. 12/15/20  Merrilee Jansky, MD    Allergies    Patient has no known allergies.  Review of Systems   Review of Systems  Unable to perform ROS: Mental status change  Neurological:  Positive for seizures.   Physical Exam Updated Vital Signs BP (!) 158/82   Pulse 90   Temp 98.3 F (36.8 C)   Resp 16   SpO2 92%   Physical Exam Vitals and nursing note reviewed.  Constitutional:      General: He is not in acute distress.    Appearance: He is not diaphoretic.  HENT:     Head: Normocephalic.     Comments: Tongue trauma Eyes:     General: No scleral  icterus.    Conjunctiva/sclera: Conjunctivae normal.  Cardiovascular:     Rate and Rhythm: Normal rate and regular rhythm.     Pulses: Normal pulses.          Radial pulses are 2+ on the right side and 2+ on the left side.  Pulmonary:     Effort: No tachypnea, accessory muscle usage, prolonged expiration, respiratory distress or retractions.     Breath sounds: No stridor.     Comments: Equal chest rise. No increased work of breathing. Chest:    Abdominal:     General: There is no distension.     Palpations: Abdomen is soft.     Tenderness: There is no abdominal tenderness. There is no guarding or rebound.  Musculoskeletal:     Cervical back: Normal range of motion.     Comments: Moves all extremities equally and without difficulty.  Skin:    General: Skin is warm and dry.     Capillary Refill: Capillary refill takes less than 2 seconds.  Neurological:     Mental Status: He is alert. He is confused.     GCS: GCS eye subscore is 4. GCS verbal subscore is 4. GCS motor subscore is 6.     Comments: On arrival patient confused but following commands.  Knows his name and that he is at the hospital but cannot answer any other orientation questions.  Sensation intact to bilateral upper and lower extremities.  Smile symmetric.  No slurred speech.  Strength 5/5 in the bilateral upper extremities.  5/5 with dorsiflexion and plantarflexion in the bilateral lower extremities however patient unable to fully lift either leg off the bed.  Finger-nose without ataxia.  Gait testing not attempted.  Psychiatric:        Mood and Affect: Mood normal.    ED Results / Procedures / Treatments   Labs (all labs ordered are listed, but only abnormal results are displayed) Labs Reviewed  APTT - Abnormal; Notable for the following components:      Result Value   aPTT 23 (*)    All other components within normal limits  CBC - Abnormal; Notable for the following components:   WBC 10.7 (*)    HCT 38.8 (*)     All other components within normal limits  COMPREHENSIVE METABOLIC PANEL - Abnormal; Notable for the following components:   Sodium 132 (*)    CO2 16 (*)    Glucose, Bld 561 (*)    Creatinine, Ser 1.97 (*)    Albumin 3.4 (*)    GFR, Estimated 37 (*)    Anion gap 18 (*)    All other components within normal limits  URINALYSIS, ROUTINE W REFLEX MICROSCOPIC - Abnormal; Notable for the following components:   Color, Urine STRAW (*)  Glucose, UA >=500 (*)    Hgb urine dipstick MODERATE (*)    Ketones, ur 5 (*)    Bacteria, UA RARE (*)    All other components within normal limits  AMMONIA - Abnormal; Notable for the following components:   Ammonia 60 (*)    All other components within normal limits  I-STAT CHEM 8, ED - Abnormal; Notable for the following components:   Potassium 3.3 (*)    Creatinine, Ser 1.70 (*)    Glucose, Bld 577 (*)    TCO2 17 (*)    All other components within normal limits  I-STAT VENOUS BLOOD GAS, ED - Abnormal; Notable for the following components:   pH, Ven 7.119 (*)    pO2, Ven 189.0 (*)    Bicarbonate 15.2 (*)    TCO2 17 (*)    Acid-base deficit 14.0 (*)    Potassium 3.3 (*)    All other components within normal limits  I-STAT ARTERIAL BLOOD GAS, ED - Abnormal; Notable for the following components:   pO2, Arterial 78 (*)    Sodium 134 (*)    HCT 36.0 (*)    Hemoglobin 12.2 (*)    All other components within normal limits  RESP PANEL BY RT-PCR (FLU A&B, COVID) ARPGX2  ETHANOL  PROTIME-INR  DIFFERENTIAL  RAPID URINE DRUG SCREEN, HOSP PERFORMED  BETA-HYDROXYBUTYRIC ACID  MAGNESIUM  BRAIN NATRIURETIC PEPTIDE  BLOOD GAS, VENOUS  HIV ANTIBODY (ROUTINE TESTING W REFLEX)  CBC  CREATININE, SERUM  TROPONIN I (HIGH SENSITIVITY)  TROPONIN I (HIGH SENSITIVITY)    EKG EKG Interpretation  Date/Time:  Saturday July 24 2021 23:34:23 EDT Ventricular Rate:  97 PR Interval:  224 QRS Duration: 109 QT Interval:  365 QTC Calculation: 464 R  Axis:   52 Text Interpretation: Sinus rhythm Prolonged PR interval Borderline ST depression, diffuse leads Abnormal T, consider ischemia, diffuse leads Confirmed by Tilden Fossa [phone]) on 07/25/2021 12:02:46 AM    Radiology CT HEAD WO CONTRAST  Result Date: 07/24/2021 CLINICAL DATA:  Altered mental status, incontinence, seizure EXAM: CT HEAD WITHOUT CONTRAST TECHNIQUE: Contiguous axial images were obtained from the base of the skull through the vertex without intravenous contrast. COMPARISON:  None. FINDINGS: Brain: Normal anatomic configuration of the brain. Age-indeterminate lacunar infarct within the posterior limb of the left internal capsule. No acute intracranial hemorrhage. No abnormal mass effect or midline shift. No abnormal intra or extra-axial mass lesion. Ventricular size is normal. Cerebellum is unremarkable. Vascular: No asymmetric hyperdense vasculature at the skull base. Skull: Intact Sinuses/Orbits: Bilateral maxillary antrectomy, turbinectomy, and partial ethmoidectomy has been performed. There is moderate residual mucosal thickening within the maxillary antra as well as within the residual ethmoid air cells. No air-fluid levels. Orbits are unremarkable. Other: Mastoid air cells and middle ear cavities are clear. IMPRESSION: Age-indeterminate lacunar infarct within the posterior limb of the left internal capsule. If indicated, this could be better aged utilizing MRI examination. No acute intracranial hemorrhage. Postsurgical changes within the paranasal sinuses, as outlined above, with residual mild paranasal sinus disease. Electronically Signed   By: Helyn Numbers M.D.   On: 07/24/2021 23:00   DG Chest Port 1 View  Result Date: 07/24/2021 CLINICAL DATA:  Altered mental status. EXAM: PORTABLE CHEST 1 VIEW COMPARISON:  Chest x-ray 06/22/2011. FINDINGS: Markedly limited evaluation of the cardiomediastinal silhouette due to patient rotation. No focal consolidation. No pulmonary edema.  No pleural effusion. No pneumothorax. No acute osseous abnormality. IMPRESSION: Markedly limited evaluation of the cardiomediastinal silhouette  due to patient rotation. Recommend repeat frontal view of the chest. Electronically Signed   By: Tish Frederickson M.D.   On: 07/24/2021 23:52    Procedures .Critical Care Performed by: Dierdre Forth, PA-C Authorized by: Dierdre Forth, PA-C   Critical care provider statement:    Critical care time (minutes):  65   Critical care time was exclusive of:  Separately billable procedures and treating other patients and teaching time   Critical care was necessary to treat or prevent imminent or life-threatening deterioration of the following conditions:  CNS failure or compromise   Critical care was time spent personally by me on the following activities:  Discussions with consultants, evaluation of patient's response to treatment, examination of patient, ordering and performing treatments and interventions, ordering and review of laboratory studies, ordering and review of radiographic studies, pulse oximetry, re-evaluation of patient's condition, obtaining history from patient or surrogate and review of old charts   I assumed direction of critical care for this patient from another provider in my specialty: no     Care discussed with: admitting provider     Medications Ordered in ED Medications  LORazepam (ATIVAN) tablet 1-4 mg (has no administration in time range)    Or  LORazepam (ATIVAN) injection 1-4 mg (has no administration in time range)  thiamine tablet 100 mg (has no administration in time range)    Or  thiamine (B-1) injection 100 mg (has no administration in time range)  folic acid (FOLVITE) tablet 1 mg (has no administration in time range)  multivitamin with minerals tablet 1 tablet (has no administration in time range)  enoxaparin (LOVENOX) injection 40 mg (has no administration in time range)  LORazepam (ATIVAN) 2 MG/ML injection  (2 mg  Given 07/24/21 2306)  sodium chloride 0.9 % bolus 1,000 mL ( Intravenous Rate/Dose Change 07/24/21 2336)    ED Course  I have reviewed the triage vital signs and the nursing notes.  Pertinent labs & imaging results that were available during my care of the patient were reviewed by me and considered in my medical decision making (see chart for details).  Clinical Course as of 07/25/21 0450  Sat Jul 24, 2021  2318 Pt with grand mal seizure in the bathroom - witnessed by RN who was assisting.  Pt did not fall or hit his head. Ativan 2mg  given [HM]  Sun Jul 25, 2021  Jul 27, 2021 Discussed with neurology who will evaluate. [HM]    Clinical Course User Index [HM] Jaime Dome, 9562   MDM Rules/Calculators/A&P                           Presents with altered mental status.  Suspected seizure.  Patient unable to lift bilateral legs off the bed right slightly weaker than left but 5/5 strength with dorsiflexion plantarflexion.  Tongue lesion consistent with seizure-like activity.  No seizures noted in his chart.  Work-up initiated including head CT for possible stroke.  11:23 PM Patient with additional grand mal seizure witnessed by RN.  He did not fall or hit his head.  He groans to painful stimuli but does not open his eyes or answer questions.  Patient given Ativan 2 mg.  CT scan with age-indeterminate lacunar infarct.  MRI ordered.  Labs show hyperglycemia with elevated anion gap but normal beta hydroxy.  Small fluid bolus given.  12:50 AM Arouses to voice.  Can state his name and where he is but remains sleepy.  Will consult with neurology.  2:12 AM Mildly suspicious for alcohol withdrawal.  Orders placed.  Discussed with Dr. Margo Aye of [facility] who will admit.  Neurology will follow.   Final Clinical Impression(s) / ED Diagnoses Final diagnoses:  Seizure (HCC)  Altered mental status, unspecified altered mental status type    Rx / DC Orders ED Discharge Orders     None         Zaevion Parke, Boyd Kerbs 07/25/21 5449    Chritopher Coster, Dahlia Client, PA-C 07/25/21 0454    Zadie Rhine, MD 07/25/21 [phone]

## 2021-07-25 ENCOUNTER — Inpatient Hospital Stay (HOSPITAL_COMMUNITY): Payer: Medicare HMO

## 2021-07-25 DIAGNOSIS — I10 Essential (primary) hypertension: Secondary | ICD-10-CM

## 2021-07-25 DIAGNOSIS — E872 Acidosis, unspecified: Secondary | ICD-10-CM | POA: Diagnosis present

## 2021-07-25 DIAGNOSIS — R4182 Altered mental status, unspecified: Secondary | ICD-10-CM | POA: Diagnosis not present

## 2021-07-25 DIAGNOSIS — Z6831 Body mass index (BMI) 31.0-31.9, adult: Secondary | ICD-10-CM | POA: Diagnosis not present

## 2021-07-25 DIAGNOSIS — Z20822 Contact with and (suspected) exposure to covid-19: Secondary | ICD-10-CM | POA: Diagnosis present

## 2021-07-25 DIAGNOSIS — N179 Acute kidney failure, unspecified: Secondary | ICD-10-CM | POA: Diagnosis present

## 2021-07-25 DIAGNOSIS — E11649 Type 2 diabetes mellitus with hypoglycemia without coma: Secondary | ICD-10-CM | POA: Diagnosis present

## 2021-07-25 DIAGNOSIS — E876 Hypokalemia: Secondary | ICD-10-CM | POA: Diagnosis present

## 2021-07-25 DIAGNOSIS — E119 Type 2 diabetes mellitus without complications: Secondary | ICD-10-CM | POA: Diagnosis not present

## 2021-07-25 DIAGNOSIS — E669 Obesity, unspecified: Secondary | ICD-10-CM | POA: Diagnosis present

## 2021-07-25 DIAGNOSIS — N400 Enlarged prostate without lower urinary tract symptoms: Secondary | ICD-10-CM | POA: Diagnosis present

## 2021-07-25 DIAGNOSIS — R32 Unspecified urinary incontinence: Secondary | ICD-10-CM | POA: Diagnosis present

## 2021-07-25 DIAGNOSIS — R569 Unspecified convulsions: Secondary | ICD-10-CM | POA: Diagnosis present

## 2021-07-25 DIAGNOSIS — M25572 Pain in left ankle and joints of left foot: Secondary | ICD-10-CM | POA: Diagnosis present

## 2021-07-25 DIAGNOSIS — K219 Gastro-esophageal reflux disease without esophagitis: Secondary | ICD-10-CM | POA: Diagnosis present

## 2021-07-25 DIAGNOSIS — M109 Gout, unspecified: Secondary | ICD-10-CM | POA: Diagnosis present

## 2021-07-25 DIAGNOSIS — E1165 Type 2 diabetes mellitus with hyperglycemia: Secondary | ICD-10-CM

## 2021-07-25 DIAGNOSIS — E785 Hyperlipidemia, unspecified: Secondary | ICD-10-CM | POA: Diagnosis present

## 2021-07-25 DIAGNOSIS — G9341 Metabolic encephalopathy: Secondary | ICD-10-CM | POA: Diagnosis present

## 2021-07-25 DIAGNOSIS — Z87891 Personal history of nicotine dependence: Secondary | ICD-10-CM | POA: Diagnosis not present

## 2021-07-25 DIAGNOSIS — W19XXXA Unspecified fall, initial encounter: Secondary | ICD-10-CM | POA: Diagnosis present

## 2021-07-25 DIAGNOSIS — E860 Dehydration: Secondary | ICD-10-CM | POA: Diagnosis present

## 2021-07-25 DIAGNOSIS — Z79899 Other long term (current) drug therapy: Secondary | ICD-10-CM | POA: Diagnosis not present

## 2021-07-25 DIAGNOSIS — G40409 Other generalized epilepsy and epileptic syndromes, not intractable, without status epilepticus: Secondary | ICD-10-CM | POA: Diagnosis present

## 2021-07-25 DIAGNOSIS — Y929 Unspecified place or not applicable: Secondary | ICD-10-CM | POA: Diagnosis not present

## 2021-07-25 LAB — I-STAT ARTERIAL BLOOD GAS, ED
Acid-base deficit: 1 mmol/L (ref 0.0–2.0)
Bicarbonate: 24.4 mmol/L (ref 20.0–28.0)
Calcium, Ion: 1.25 mmol/L (ref 1.15–1.40)
HCT: 36 % — ABNORMAL LOW (ref 39.0–52.0)
Hemoglobin: 12.2 g/dL — ABNORMAL LOW (ref 13.0–17.0)
O2 Saturation: 95 %
Patient temperature: 98.7
Potassium: 4 mmol/L (ref 3.5–5.1)
Sodium: 134 mmol/L — ABNORMAL LOW (ref 135–145)
TCO2: 26 mmol/L (ref 22–32)
pCO2 arterial: 42.6 mmHg (ref 32.0–48.0)
pH, Arterial: 7.366 (ref 7.350–7.450)
pO2, Arterial: 78 mmHg — ABNORMAL LOW (ref 83.0–108.0)

## 2021-07-25 LAB — CBC
HCT: 37.5 % — ABNORMAL LOW (ref 39.0–52.0)
HCT: 37.6 % — ABNORMAL LOW (ref 39.0–52.0)
Hemoglobin: 12.5 g/dL — ABNORMAL LOW (ref 13.0–17.0)
Hemoglobin: 12.5 g/dL — ABNORMAL LOW (ref 13.0–17.0)
MCH: 29.8 pg (ref 26.0–34.0)
MCH: 29.9 pg (ref 26.0–34.0)
MCHC: 33.3 g/dL (ref 30.0–36.0)
MCHC: 33.2 g/dL (ref 30.0–36.0)
MCV: 89.5 fL (ref 80.0–100.0)
MCV: 90 fL (ref 80.0–100.0)
Platelets: 226 10*3/uL (ref 150–400)
Platelets: 227 10*3/uL (ref 150–400)
RBC: 4.19 MIL/uL — ABNORMAL LOW (ref 4.22–5.81)
RBC: 4.18 MIL/uL — ABNORMAL LOW (ref 4.22–5.81)
RDW: 12.3 % (ref 11.5–15.5)
RDW: 12.4 % (ref 11.5–15.5)
WBC: 11.7 10*3/uL — ABNORMAL HIGH (ref 4.0–10.5)
WBC: 11.7 10*3/uL — ABNORMAL HIGH (ref 4.0–10.5)
nRBC: 0 % (ref 0.0–0.2)
nRBC: 0 % (ref 0.0–0.2)

## 2021-07-25 LAB — CREATININE, SERUM
Creatinine, Ser: 1.66 mg/dL — ABNORMAL HIGH (ref 0.61–1.24)
GFR, Estimated: 45 mL/min — ABNORMAL LOW (ref >60)

## 2021-07-25 LAB — BASIC METABOLIC PANEL
Anion gap: 11 (ref 5–15)
BUN: 13 mg/dL (ref 8–23)
CO2: 22 mmol/L (ref 22–32)
Calcium: 9.1 mg/dL (ref 8.9–10.3)
Chloride: 101 mmol/L (ref 98–111)
Creatinine, Ser: 1.62 mg/dL — ABNORMAL HIGH (ref 0.61–1.24)
GFR, Estimated: 47 mL/min — ABNORMAL LOW (ref >60)
Glucose, Bld: 507 mg/dL (ref 70–99)
Potassium: 4.1 mmol/L (ref 3.5–5.1)
Sodium: 134 mmol/L — ABNORMAL LOW (ref 135–145)

## 2021-07-25 LAB — TROPONIN I (HIGH SENSITIVITY): Troponin I (High Sensitivity): 17 ng/L (ref <18)

## 2021-07-25 LAB — I-STAT VENOUS BLOOD GAS, ED
Acid-base deficit: 14 mmol/L — ABNORMAL HIGH (ref 0.0–2.0)
Bicarbonate: 15.2 mmol/L — ABNORMAL LOW (ref 20.0–28.0)
Calcium, Ion: 1.17 mmol/L (ref 1.15–1.40)
HCT: 42 % (ref 39.0–52.0)
Hemoglobin: 14.3 g/dL (ref 13.0–17.0)
O2 Saturation: 99 %
Potassium: 3.3 mmol/L — ABNORMAL LOW (ref 3.5–5.1)
Sodium: 135 mmol/L (ref 135–145)
TCO2: 17 mmol/L — ABNORMAL LOW (ref 22–32)
pCO2, Ven: 46.8 mmHg (ref 44.0–60.0)
pH, Ven: 7.119 — CL (ref 7.250–7.430)
pO2, Ven: 189 mmHg — ABNORMAL HIGH (ref 32.0–45.0)

## 2021-07-25 LAB — RESP PANEL BY RT-PCR (FLU A&B, COVID) ARPGX2
Influenza A by PCR: NEGATIVE
Influenza B by PCR: NEGATIVE
SARS Coronavirus 2 by RT PCR: NEGATIVE

## 2021-07-25 LAB — I-STAT CHEM 8, ED
BUN: 16 mg/dL (ref 8–23)
Calcium, Ion: 1.18 mmol/L (ref 1.15–1.40)
Chloride: 100 mmol/L (ref 98–111)
Creatinine, Ser: 1.7 mg/dL — ABNORMAL HIGH (ref 0.61–1.24)
Glucose, Bld: 577 mg/dL (ref 70–99)
HCT: 43 % (ref 39.0–52.0)
Hemoglobin: 14.6 g/dL (ref 13.0–17.0)
Potassium: 3.3 mmol/L — ABNORMAL LOW (ref 3.5–5.1)
Sodium: 136 mmol/L (ref 135–145)
TCO2: 17 mmol/L — ABNORMAL LOW (ref 22–32)

## 2021-07-25 LAB — URINALYSIS, ROUTINE W REFLEX MICROSCOPIC
Bilirubin Urine: NEGATIVE
Glucose, UA: >=500 mg/dL — AB
Ketones, ur: 5 mg/dL — AB
Leukocytes,Ua: NEGATIVE
Nitrite: NEGATIVE
Protein, ur: NEGATIVE mg/dL
Specific Gravity, Urine: 1.021 (ref 1.005–1.030)
pH: 5 (ref 5.0–8.0)

## 2021-07-25 LAB — RAPID URINE DRUG SCREEN, HOSP PERFORMED
Amphetamines: NOT DETECTED
Barbiturates: NOT DETECTED
Benzodiazepines: NOT DETECTED
Cocaine: NOT DETECTED
Opiates: NOT DETECTED
Tetrahydrocannabinol: NOT DETECTED

## 2021-07-25 LAB — CBG MONITORING, ED
Glucose-Capillary: 492 mg/dL — ABNORMAL HIGH (ref 70–99)
Glucose-Capillary: 464 mg/dL — ABNORMAL HIGH (ref 70–99)
Glucose-Capillary: 381 mg/dL — ABNORMAL HIGH (ref 70–99)
Glucose-Capillary: 256 mg/dL — ABNORMAL HIGH (ref 70–99)
Glucose-Capillary: 60 mg/dL — ABNORMAL LOW (ref 70–99)
Glucose-Capillary: 75 mg/dL (ref 70–99)
Glucose-Capillary: 110 mg/dL — ABNORMAL HIGH (ref 70–99)
Glucose-Capillary: 186 mg/dL — ABNORMAL HIGH (ref 70–99)
Glucose-Capillary: 275 mg/dL — ABNORMAL HIGH (ref 70–99)

## 2021-07-25 LAB — HEMOGLOBIN A1C
Hgb A1c MFr Bld: 11.7 % — ABNORMAL HIGH (ref 4.8–5.6)
Mean Plasma Glucose: 289.09 mg/dL

## 2021-07-25 LAB — BETA-HYDROXYBUTYRIC ACID: Beta-Hydroxybutyric Acid: 0.62 mmol/L — ABNORMAL HIGH (ref 0.05–0.27)

## 2021-07-25 LAB — HIV ANTIBODY (ROUTINE TESTING W REFLEX): HIV Screen 4th Generation wRfx: NONREACTIVE

## 2021-07-25 LAB — LIPID PANEL
Cholesterol: 194 mg/dL (ref 0–200)
HDL: 43 mg/dL (ref >40)
LDL Cholesterol: 118 mg/dL — ABNORMAL HIGH (ref 0–99)
Total CHOL/HDL Ratio: 4.5 RATIO
Triglycerides: 163 mg/dL — ABNORMAL HIGH (ref <150)
VLDL: 33 mg/dL (ref 0–40)

## 2021-07-25 LAB — BRAIN NATRIURETIC PEPTIDE: B Natriuretic Peptide: 31.7 pg/mL (ref 0.0–100.0)

## 2021-07-25 MED ORDER — LORAZEPAM 1 MG PO TABS
1.0000 mg | ORAL_TABLET | ORAL | Status: AC | PRN
  Administered 2021-07-25: 1 mg via ORAL
  Filled 2021-07-25: qty 1

## 2021-07-25 MED ORDER — THIAMINE HCL 100 MG PO TABS
100.0000 mg | ORAL_TABLET | Freq: Every day | ORAL | Status: DC
  Administered 2021-07-25 – 2021-08-02 (×9): 100 mg via ORAL
  Filled 2021-07-25 (×9): qty 1

## 2021-07-25 MED ORDER — LORAZEPAM 2 MG/ML IJ SOLN
1.0000 mg | INTRAMUSCULAR | Status: AC | PRN
  Administered 2021-07-25: 1 mg via INTRAVENOUS
  Filled 2021-07-25: qty 1

## 2021-07-25 MED ORDER — INSULIN ASPART 100 UNIT/ML IJ SOLN
0.0000 [IU] | INTRAMUSCULAR | Status: DC
Start: 2021-07-25 — End: 2021-07-26
  Administered 2021-07-25 (×2): 11 [IU] via SUBCUTANEOUS
  Administered 2021-07-25: 20 [IU] via SUBCUTANEOUS
  Administered 2021-07-25 – 2021-07-26 (×2): 4 [IU] via SUBCUTANEOUS
  Administered 2021-07-26: 7 [IU] via SUBCUTANEOUS

## 2021-07-25 MED ORDER — LORAZEPAM 2 MG/ML IJ SOLN: 3.0000 mg | INTRAMUSCULAR | Status: DC | PRN

## 2021-07-25 MED ORDER — THIAMINE HCL 100 MG/ML IJ SOLN: 100.0000 mg | Freq: Every day | INTRAMUSCULAR | Status: DC

## 2021-07-25 MED ORDER — ENOXAPARIN SODIUM 40 MG/0.4ML IJ SOSY
40.0000 mg | PREFILLED_SYRINGE | Freq: Every day | INTRAMUSCULAR | Status: DC
  Administered 2021-07-25 – 2021-08-02 (×9): 40 mg via SUBCUTANEOUS
  Filled 2021-07-25 (×9): qty 0.4

## 2021-07-25 MED ORDER — INSULIN GLARGINE-YFGN 100 UNIT/ML ~~LOC~~ SOLN
10.0000 [IU] | Freq: Every day | SUBCUTANEOUS | Status: DC
  Administered 2021-07-26: 10 [IU] via SUBCUTANEOUS
  Filled 2021-07-25: qty 0.1

## 2021-07-25 MED ORDER — LORAZEPAM 2 MG/ML IJ SOLN: 2.0000 mg | INTRAMUSCULAR | Status: DC | PRN

## 2021-07-25 MED ORDER — INSULIN GLARGINE-YFGN 100 UNIT/ML ~~LOC~~ SOLN: 10.0000 [IU] | Freq: Every day | SUBCUTANEOUS | Status: DC

## 2021-07-25 MED ORDER — INSULIN ASPART 100 UNIT/ML IJ SOLN
15.0000 [IU] | INTRAMUSCULAR | Status: AC
  Administered 2021-07-25: 15 [IU] via SUBCUTANEOUS

## 2021-07-25 MED ORDER — INSULIN GLARGINE-YFGN 100 UNIT/ML ~~LOC~~ SOLN
15.0000 [IU] | Freq: Every day | SUBCUTANEOUS | Status: DC
  Administered 2021-07-25: 15 [IU] via SUBCUTANEOUS
  Filled 2021-07-25: qty 0.15

## 2021-07-25 MED ORDER — ADULT MULTIVITAMIN W/MINERALS CH
1.0000 | ORAL_TABLET | Freq: Every day | ORAL | Status: DC
Start: 2021-07-25 — End: 2021-08-03
  Administered 2021-07-25 – 2021-08-02 (×9): 1 via ORAL
  Filled 2021-07-25 (×9): qty 1

## 2021-07-25 MED ORDER — FOLIC ACID 1 MG PO TABS
1.0000 mg | ORAL_TABLET | Freq: Every day | ORAL | Status: DC
  Administered 2021-07-25 – 2021-08-02 (×9): 1 mg via ORAL
  Filled 2021-07-25 (×9): qty 1

## 2021-07-25 MED ORDER — LACTATED RINGERS IV SOLN: INTRAVENOUS | Status: DC

## 2021-07-25 MED ORDER — INSULIN GLARGINE-YFGN 100 UNIT/ML ~~LOC~~ SOLN
10.0000 [IU] | Freq: Every day | SUBCUTANEOUS | Status: DC
  Filled 2021-07-25: qty 0.1

## 2021-07-25 NOTE — Progress Notes (Signed)
Progress Note    Wayne Wood  GEX:[id number] DOB: August 07, 1956  DOA: 07/24/2021 PCP: Raymon Mutton., FNP    Brief Narrative:     Medical records reviewed and are as summarized below:  Wayne Wood is an 65 y.o. male with medical history significant for essential hypertension, hyperlipidemia, GERD, type 2 diabetes, obesity, who presented to [facility] ED due to altered mental status and suspected seizure.  Spoke with neurology who thinks provoked seizures from hyperglycemia.    Assessment/Plan:   Active Problems:   Essential hypertension   Seizure (HCC)   DM (diabetes mellitus), type 2, uncontrolled (HCC)   New onset seizure activity -neurology who thinks provoked seizures from hyperglycemia.   -consider antiepileptics as well as EEG if he has another seizure -MRI negative for acute issues   Acute metabolic encephalopathy, postictal -appears improved-- able to state name and year   Type 2 diabetes complicated by hyperglycemia Lantus plus q 4 SSI for now -add diet once more awake   AKI, suspect prerenal in setting of dehydration (last CR was 10 years ago in our system) Baseline creatinine appears to be 0.9 with GFR greater than 60 Presented with creatinine 1.97 with GFR 37 IVF and daily labs   Anion gap metabolic acidosis in the setting of acute renal failure -resolved     Family Communication/Anticipated D/C date and plan/Code Status   DVT prophylaxis: Lovenox ordered. Code Status: Full Code.  Disposition Plan: Status is: Inpatient  Remains inpatient appropriate because:Inpatient level of care appropriate due to severity of illness  Dispo:  Patient From: Home  Planned Disposition: Home  Medically stable for discharge: No           Medical Consultants:   Neuro (signed off: provoked seizure- re consult if another occurs)    Subjective:   No SOB, has dentures that are loose in his mouth  Objective:    Vitals:   07/25/21 0815  07/25/21 0830 07/25/21 0845 07/25/21 0856  BP: 137/74 137/80 135/77 135/77  Pulse: 74 76 86 86  Resp:      Temp:      TempSrc:      SpO2: 98% 98% 96%     Intake/Output Summary (Last 24 hours) at 07/25/2021 0919 Last data filed at 07/25/2021 0314 Gross per 24 hour  Intake 1500 ml  Output --  Net 1500 ml   There were no vitals filed for this visit.  Exam:  General: Appearance:    male in no acute distress, laying flat in bed     Lungs:     Clear to auscultation bilaterally, respirations unlabored  Heart:    Normal heart rate. Normal rhythm. No murmurs, rubs, or gallops.    MS:   All extremities are intact.    Neurologic:   Awake, alert, oriented x 3. No apparent focal neurological           defect.      Data Reviewed:   I have personally reviewed following labs and imaging studies:  Labs: Labs show the following:   Basic Metabolic Panel: Recent Labs  Lab 07/24/21 2226 07/25/21 0017 07/25/21 0132 07/25/21 0204 07/25/21 0420  NA 132* 136  135 134*  --  134*  K 4.1 3.3*  3.3* 4.0  --  4.1  CL 98 100  --   --  101  CO2 16*  --   --   --  22  GLUCOSE 561* 577*  --   --  507*  BUN 13 16  --   --  13  CREATININE 1.97* 1.70*  --  1.66* 1.62*  CALCIUM 9.4  --   --   --  9.1  MG 2.0  --   --   --   --    GFR CrCl cannot be calculated (Unknown ideal weight.). Liver Function Tests: Recent Labs  Lab 07/24/21 2226  AST 27  ALT 22  ALKPHOS 100  BILITOT 0.6  PROT 7.1  ALBUMIN 3.4*   No results for input(s): LIPASE, AMYLASE in the last 168 hours. Recent Labs  Lab 07/24/21 2310  AMMONIA 60*   Coagulation profile Recent Labs  Lab 07/24/21 2226  INR 1.0    CBC: Recent Labs  Lab 07/24/21 2226 07/25/21 0017 07/25/21 0132 07/25/21 0204  WBC 10.7*  --   --  11.7*  NEUTROABS 7.2  --   --   --   HGB 13.0 14.6  14.3 12.2* 12.5*  HCT 38.8* 43.0  42.0 36.0* 37.5*  MCV 91.5  --   --  89.5  PLT 229  --   --  226   Cardiac Enzymes: No results for  input(s): CKTOTAL, CKMB, CKMBINDEX, TROPONINI in the last 168 hours. BNP (last 3 results) No results for input(s): PROBNP in the last 8760 hours. CBG: Recent Labs  Lab 07/25/21 0230 07/25/21 0418 07/25/21 0607 07/25/21 0808  GLUCAP 492* 464* 381* 256*   D-Dimer: No results for input(s): DDIMER in the last 72 hours. Hgb A1c: Recent Labs    07/25/21 0204  HGBA1C 11.7*   Lipid Profile: Recent Labs    07/25/21 0420  CHOL 194  HDL 43  LDLCALC 118*  TRIG 163*  CHOLHDL 4.5   Thyroid function studies: No results for input(s): TSH, T4TOTAL, T3FREE, THYROIDAB in the last 72 hours.  Invalid input(s): FREET3 Anemia work up: No results for input(s): VITAMINB12, FOLATE, FERRITIN, TIBC, IRON, RETICCTPCT in the last 72 hours. Sepsis Labs: Recent Labs  Lab 07/24/21 2226 07/25/21 0204  WBC 10.7* 11.7*    Microbiology Recent Results (from the past 240 hour(s))  Resp Panel by RT-PCR (Flu A&B, Covid) Nasopharyngeal Swab     Status: None   Collection Time: 07/24/21 10:26 PM   Specimen: Nasopharyngeal Swab; Nasopharyngeal(NP) swabs in vial transport medium  Result Value Ref Range Status   SARS Coronavirus 2 by RT PCR NEGATIVE NEGATIVE Final    Comment: (NOTE) SARS-CoV-2 target nucleic acids are NOT DETECTED.  The SARS-CoV-2 RNA is generally detectable in upper respiratory specimens during the acute phase of infection. The lowest concentration of SARS-CoV-2 viral copies this assay can detect is 138 copies/mL. A negative result does not preclude SARS-Cov-2 infection and should not be used as the sole basis for treatment or other patient management decisions. A negative result may occur with  improper specimen collection/handling, submission of specimen other than nasopharyngeal swab, presence of viral mutation(s) within the areas targeted by this assay, and inadequate number of viral copies(<138 copies/mL). A negative result must be combined with clinical observations, patient  history, and epidemiological information. The expected result is Negative.  Fact Sheet for Patients:  BloggerCourse.com  Fact Sheet for Healthcare Providers:  SeriousBroker.it  This test is no t yet approved or cleared by the [place] FDA and  has been authorized for detection and/or diagnosis of SARS-CoV-2 by FDA under an Emergency Use Authorization (EUA). This EUA will remain  in effect (meaning this test can be used) for the duration  of the COVID-19 declaration under Section 564(b)(1) of the Act, 21 U.S.C.section 360bbb-3(b)(1), unless the authorization is terminated  or revoked sooner.       Influenza A by PCR NEGATIVE NEGATIVE Final   Influenza B by PCR NEGATIVE NEGATIVE Final    Comment: (NOTE) The Xpert Xpress SARS-CoV-2/FLU/RSV plus assay is intended as an aid in the diagnosis of influenza from Nasopharyngeal swab specimens and should not be used as a sole basis for treatment. Nasal washings and aspirates are unacceptable for Xpert Xpress SARS-CoV-2/FLU/RSV testing.  Fact Sheet for Patients: BloggerCourse.com  Fact Sheet for Healthcare Providers: SeriousBroker.it  This test is not yet approved or cleared by the [place] FDA and has been authorized for detection and/or diagnosis of SARS-CoV-2 by FDA under an Emergency Use Authorization (EUA). This EUA will remain in effect (meaning this test can be used) for the duration of the COVID-19 declaration under Section 564(b)(1) of the Act, 21 U.S.C. section 360bbb-3(b)(1), unless the authorization is terminated or revoked.  Performed at [facility], [address] [place], [place] [address]     Procedures and diagnostic studies:  CT HEAD WO CONTRAST  Result Date: 07/24/2021 CLINICAL DATA:  Altered mental status, incontinence, seizure EXAM: CT HEAD WITHOUT CONTRAST TECHNIQUE: Contiguous axial images  were obtained from the base of the skull through the vertex without intravenous contrast. COMPARISON:  None. FINDINGS: Brain: Normal anatomic configuration of the brain. Age-indeterminate lacunar infarct within the posterior limb of the left internal capsule. No acute intracranial hemorrhage. No abnormal mass effect or midline shift. No abnormal intra or extra-axial mass lesion. Ventricular size is normal. Cerebellum is unremarkable. Vascular: No asymmetric hyperdense vasculature at the skull base. Skull: Intact Sinuses/Orbits: Bilateral maxillary antrectomy, turbinectomy, and partial ethmoidectomy has been performed. There is moderate residual mucosal thickening within the maxillary antra as well as within the residual ethmoid air cells. No air-fluid levels. Orbits are unremarkable. Other: Mastoid air cells and middle ear cavities are clear. IMPRESSION: Age-indeterminate lacunar infarct within the posterior limb of the left internal capsule. If indicated, this could be better aged utilizing MRI examination. No acute intracranial hemorrhage. Postsurgical changes within the paranasal sinuses, as outlined above, with residual mild paranasal sinus disease. Electronically Signed   By: Helyn Numbers M.D.   On: 07/24/2021 23:00   MR BRAIN WO CONTRAST  Result Date: 07/25/2021 CLINICAL DATA:  65 year old male with unexplained altered mental status. Age indeterminate left internal capsule lacunar infarct on head CT yesterday. EXAM: MRI HEAD WITHOUT CONTRAST TECHNIQUE: Multiplanar, multiecho pulse sequences of the brain and surrounding structures were obtained without intravenous contrast. COMPARISON:  Head CT 07/24/2021. FINDINGS: Brain: No restricted diffusion to suggest acute infarction. No midline shift, mass effect, evidence of mass lesion, ventriculomegaly, extra-axial collection or acute intracranial hemorrhage. Cervicomedullary junction and pituitary are within normal limits. Small chronic lacunar infarct of the  medial left thalamus. Additional mild T2 and FLAIR heterogeneity in the basal ganglia/internal capsule region more resemble perivascular spaces than chronic ischemia. Elsewhere Wallace Cullens and white matter signal is within normal limits for age throughout the brain. No cortical encephalomalacia. No chronic cerebral blood products. Cerebral volume is within normal limits for age. Vascular: Major intracranial vascular flow voids are preserved. Skull and upper cervical spine: Mild cervical disc degeneration. Visualized bone marrow signal is within normal limits. Sinuses/Orbits: Negative orbits. Bilateral paranasal sinus mucoperiosteal thickening is stable. Other: Mastoids are clear. Grossly normal visible internal auditory structures. Negative visible scalp and face. IMPRESSION: 1. No acute  intracranial abnormality. 2. 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 Otherwise normal for age noncontrast MRI appearance of the brain. 3. Chronic paranasal sinus disease. Electronically Signed   By: Odessa Fleming M.D.   On: 07/25/2021 04:05   DG Chest Port 1 View  Result Date: 07/24/2021 CLINICAL DATA:  Altered mental status. EXAM: PORTABLE CHEST 1 VIEW COMPARISON:  Chest x-ray 06/22/2011. FINDINGS: Markedly limited evaluation of the cardiomediastinal silhouette due to patient rotation. No focal consolidation. No pulmonary edema. No pleural effusion. No pneumothorax. No acute osseous abnormality. IMPRESSION: Markedly limited evaluation of the cardiomediastinal silhouette due to patient rotation. Recommend repeat frontal view of the chest. Electronically Signed   By: Tish Frederickson M.D.   On: 07/24/2021 23:52    Medications:    enoxaparin (LOVENOX) injection  40 mg Subcutaneous Daily   folic acid  1 mg Oral Daily   insulin aspart  0-20 Units Subcutaneous Q4H   insulin glargine-yfgn  15 Units Subcutaneous Daily   multivitamin with minerals  1 tablet Oral Daily   thiamine  100 mg Oral Daily   Or   thiamine  100 mg Intravenous  Daily   Continuous Infusions:  lactated ringers 75 mL/hr at 07/25/21 0234     LOS: 0 days   Joseph Art  [facility]   How to contact the [facility] Attending or Consulting provider 7A - 7P or covering provider during after hours 7P -7A, for this patient?  Check the care team in [facility] and look for a) attending/consulting [facility] provider listed and b) the [facility] team listed Log into [email or web address] and use [place] universal password to access. If you do not have the password, please contact the hospital operator. Locate the [facility] provider you are looking for under [facility] and page to a number that you can be directly reached. If you still have difficulty reaching the provider, please page the [facility] (Director on Call) for the Hospitalists listed on amion for assistance.  07/25/2021, 9:19 AM

## 2021-07-25 NOTE — ED Notes (Signed)
Sleeping, arousable to voice, interactive with eyes closed, speaking clearly, no sz activity noted, oriented to person, situation and place, denies pain, HA or oral pain, dried blood and chapped lips noted, VSS on monitor, IVF infusing, NAD, calm, states "just sleepy".

## 2021-07-25 NOTE — ED Notes (Signed)
Remains sleepy, arousable to voice, interactive, NAD, calm, denies sx or complaints, moved to yellow 45, report given.

## 2021-07-25 NOTE — Consult Note (Signed)
Neurology Consult H&P  Wayne Wood MR# [id number] 07/25/2021   CC: seizure  History is obtained from: Staff, chart and patient  HPI: Wayne Wood is a 65 y.o. male PMHx as reviewed below found unresponsive in his car by neighbors and on EMS arrival was GCS of 10 with incontinence of urine and oral trauma. Neighbors reported there is no known history of seizures and his mental status had improved. He was initially hypertensive with a systolic blood pressure slightly greater than 200 on EMS arrival.   Wayne Wood to bathroom and witnessed generalized seizure lasting ~30 seconds and ED planned to admit for further evaluation and consulted neurology.   Denies f/c/n/v/cp/sob, changes in vision/hearing, numbness focal weakness.    LKW: unclear tNK given: No not candidate IR Thrombectomy No, Modified Rankin Scale: 0-Completely asymptomatic and back to baseline post- stroke NIHSS: 0  ROS: A complete ROS was performed and is negative except as noted in the HPI.   Past Medical History:  Diagnosis Date   Diabetes mellitus without complication (HCC)    Hypertension    No family history on file.  Social History:  [employer] FORMER USER  Prior to Admission medications   Medication Sig Start Date End Date Taking? Authorizing Provider  albuterol (VENTOLIN HFA) 108 (90 Base) MCG/ACT inhaler  11/09/19   [provider]  allopurinol (ZYLOPRIM) 100 MG tablet  10/09/19   [provider]  colchicine 0.6 MG tablet Take 2 tabs now, then one tab daily. 02/23/15   Hayden Rasmussen, NP  glimepiride (AMARYL) 2 MG tablet  11/20/19   [provider]  HYDROcodone-acetaminophen (NORCO/VICODIN) 5-325 MG tablet Take 1 tablet by mouth every 6 (six) hours as needed for severe pain. 12/15/20   Merrilee Jansky, MD  ibuprofen (ADVIL) 600 MG tablet Take 1 tablet (600 mg total) by mouth every 6 (six) hours as needed. 12/15/20   Merrilee Jansky, MD  latanoprost (XALATAN) 0.005 % ophthalmic  solution  01/12/20   [provider]  lisinopril (ZESTRIL) 20 MG tablet  03/09/20   [provider]  Multiple Vitamin (MULTIVITAMIN) tablet Take 1 tablet by mouth daily.    [provider]  neomycin-polymyxin-hydrocortisone (CORTISPORIN) OTIC solution Apply 1-2 drops to toe after soaking twice a day 03/26/20   Lenn Sink, DPM  omeprazole (PRILOSEC) 20 MG capsule Take 1 capsule by mouth daily. 06/01/17   [provider]  simvastatin (ZOCOR) 80 MG tablet Take 80 mg by mouth daily.    [provider]  tamsulosin (FLOMAX) 0.4 MG CAPS capsule  03/09/20   [provider]  tiZANidine (ZANAFLEX) 4 MG tablet Take 1 tablet (4 mg total) by mouth at bedtime as needed for muscle spasms. 12/15/20   LampteyBritta Mccreedy, MD   Exam: Current vital signs: BP (!) 126/58   Pulse 100   Temp 98 F (36.7 C) (Rectal)   Resp (!) 22   SpO2 94%   Physical Exam  Constitutional: Appears well-developed and well-nourished.  Psych: Affect appropriate to situation Eyes: No scleral injection HENT: No OP obstruction. Head: Normocephalic.  Cardiovascular: Normal rate and regular rhythm.  Respiratory: Effort normal, symmetric excursions bilaterally, no audible wheezing. GI: Soft.  No distension. There is no tenderness.  Skin: WDI  Neuro: Mental Status: Patient is somnolent and arouses to voice, alert, oriented to person, place and situation. Patient is able to give a clear and coherent history with continued stimulation. Speech fluent, intact comprehension and repetition. No  signs of aphasia or neglect. Visual Fields are full. Pupils are equal, round, and reactive to light. EOMI without ptosis or diploplia.  Facial sensation is symmetric to temperature Facial movement is symmetric.  Hearing is intact to voice. Uvula midline and palate elevates symmetrically. Shoulder shrug is symmetric. Tongue is midline without atrophy or fasciculations.  Follows commands  consistently. Tone is normal. Bulk is normal. 5/5 strength was present in all four extremities. Sensation is symmetric to light touch and temperature in the arms and legs. Deep Tendon Reflexes: 2+ and symmetric in the biceps and patellae. Toes are downgoing bilaterally. FNF and HKS are intact bilaterally. Gait - Deferred  I have reviewed labs in epic and the pertinent results are: Serum Glu 577 BHB 0.15  I have reviewed the images obtained: NCT head showed no large territory ischemic changes, hemorrhage, mass. Age-indeterminate lacunar stroke in posterior limb of left internal capsule.    Assessment: Wayne Wood is a 65 y.o. male PMHx as above with seizure. Semiology as described suggests generalized seizure. Patient stated he has never had seizure in the past. Chart review suggests history of EtOH, cocaine and cannabis however he states he does not use and UDS (-). Labs reveal hyperglycemia which can induce seizure.  Impression:  Seizure - Likely provoked. Hyperglycemia Pseudohyponatremia - presumed no plasma osmolality available at time to confirm. Age indeterminate lacunar infarction  Plan: - MRI brain without contrast - If MRI suggests acute stroke recommend pursuing stroke workup. - The patient seemed cognitively intact and there were no observed abnormal movements and therefore EEG not indicated at this time. - Continue to correct electrolyte derangement. - Lorazepam 3mg  PRN for seizure. - If there is another seizure without return to baseline will pursue EEG.  Electronically signed by:  , MD Page: Marisue Humble 07/25/2021, 1:00 AM

## 2021-07-25 NOTE — ED Notes (Signed)
Sleeping, arousable to entering room. Prefers eyes closed. Oriented x4. Denies pain or other sx. Denies complaints. "Feels better, making progress". BS borderline low. Drinking Orange juice when assisted.

## 2021-07-25 NOTE — H&P (Addendum)
History and Physical  SAUNDERS ARLINGTON NWG:[id number] DOB: October 14, 1956 DOA: 07/24/2021  Referring physician: Dierdre Forth, PA-EDP  PCP: Raymon Mutton., FNP  Outpatient Specialists: ENT, neurosurgery Patient coming from: Home via EMS  Chief Complaint: Seizure, altered mental status.  HPI: Wayne Wood is a 65 y.o. male with medical history significant for essential hypertension, hyperlipidemia, GERD, type 2 diabetes, obesity, who presented to [facility] ED due to altered mental status and suspected seizure.  Patient was found by his neighbor unresponsive in his car, behind the wheel, incontinent of urine.  EMS was activated.  History is mainly obtained from EDP, and review of medical records.  The writer called patient's mother x2 with no answer, left a voicemail message.  Per EMS, on their arrival they noted oral trauma and the patient appeared postictal.  Patient was brought to the ED for further evaluation.  While in the ED, he had a witnessed grand mal seizure.  Ativan was given.  EDP consulted neurology, who recommended admission.  CT head showed age indeterminate lacunar infarct within the posterior limb of the left internal capsule.  No acute intracranial hemorrhage.  Patient denies alcohol or illicit drug use.  No prior history of seizures.  TRH, hospitalist team, was asked to admit.  ED Course: Temperature 98.3.  BP 143/75, pulse 93, respiration rate 25, O2 saturation 96% on room air.  Review of Systems: Review of systems as noted in the HPI. All other systems reviewed and are negative.   Past Medical History:  Diagnosis Date   Diabetes mellitus without complication (HCC)    Hypertension    History reviewed. No pertinent surgical history.  Social History:  reports that he has never smoked. He has never used smokeless tobacco. He reports that he does not drink alcohol and does not use drugs.   No Known Allergies  Family history: Mother with hyperlipidemia  Prior to  Admission medications   Medication Sig Start Date End Date Taking? Authorizing Provider  albuterol (VENTOLIN HFA) 108 (90 Base) MCG/ACT inhaler  11/09/19   [provider]  allopurinol (ZYLOPRIM) 100 MG tablet  10/09/19   [provider]  colchicine 0.6 MG tablet Take 2 tabs now, then one tab daily. 02/23/15   Hayden Rasmussen, NP  glimepiride (AMARYL) 2 MG tablet  11/20/19   [provider]  HYDROcodone-acetaminophen (NORCO/VICODIN) 5-325 MG tablet Take 1 tablet by mouth every 6 (six) hours as needed for severe pain. 12/15/20   Merrilee Jansky, MD  ibuprofen (ADVIL) 600 MG tablet Take 1 tablet (600 mg total) by mouth every 6 (six) hours as needed. 12/15/20   Merrilee Jansky, MD  latanoprost (XALATAN) 0.005 % ophthalmic solution  01/12/20   [provider]  lisinopril (ZESTRIL) 20 MG tablet  03/09/20   [provider]  Multiple Vitamin (MULTIVITAMIN) tablet Take 1 tablet by mouth daily.    [provider]  neomycin-polymyxin-hydrocortisone (CORTISPORIN) OTIC solution Apply 1-2 drops to toe after soaking twice a day 03/26/20   Lenn Sink, DPM  omeprazole (PRILOSEC) 20 MG capsule Take 1 capsule by mouth daily. 06/01/17   [provider]  simvastatin (ZOCOR) 80 MG tablet Take 80 mg by mouth daily.    [provider]  tamsulosin (FLOMAX) 0.4 MG CAPS capsule  03/09/20   [provider]  tiZANidine (ZANAFLEX) 4 MG tablet Take 1 tablet (4 mg total) by mouth at bedtime as needed for muscle spasms. 12/15/20   Demaris Callander  O, MD    Physical Exam: BP 124/81   Pulse 91   Temp 98 F (36.7 C) (Rectal)   Resp (!) 25   SpO2 97%   General: 65 y.o. year-old male well developed well nourished in no acute distress.  Drowsy but arousable to voices.  Follows commands. Cardiovascular: Regular rate and rhythm with no rubs or gallops.  No thyromegaly or JVD noted.  No lower extremity edema. 2/4 pulses in all 4 extremities. Respiratory:  Clear to auscultation with no wheezes or rales. Good inspiratory effort. Abdomen: Soft nontender nondistended with normal bowel sounds x4 quadrants. Muskuloskeletal: No cyanosis, clubbing or edema noted bilaterally Neuro: CN II-XII intact, strength, sensation, reflexes.  Moves all 4 extremities.  Follows commands. Skin: No ulcerative lesions noted or rashes Psychiatry: Judgement and insight appear altered. Mood is appropriate for condition and setting          Labs on Admission:  Basic Metabolic Panel: Recent Labs  Lab 07/24/21 2226 07/25/21 0017 07/25/21 0132  NA 132* 136  135 134*  K 4.1 3.3*  3.3* 4.0  CL 98 100  --   CO2 16*  --   --   GLUCOSE 561* 577*  --   BUN 13 16  --   CREATININE 1.97* 1.70*  --   CALCIUM 9.4  --   --   MG 2.0  --   --    Liver Function Tests: Recent Labs  Lab 07/24/21 2226  AST 27  ALT 22  ALKPHOS 100  BILITOT 0.6  PROT 7.1  ALBUMIN 3.4*   No results for input(s): LIPASE, AMYLASE in the last 168 hours. Recent Labs  Lab 07/24/21 2310  AMMONIA 60*   CBC: Recent Labs  Lab 07/24/21 2226 07/25/21 0017 07/25/21 0132  WBC 10.7*  --   --   NEUTROABS 7.2  --   --   HGB 13.0 14.6  14.3 12.2*  HCT 38.8* 43.0  42.0 36.0*  MCV 91.5  --   --   PLT 229  --   --    Cardiac Enzymes: No results for input(s): CKTOTAL, CKMB, CKMBINDEX, TROPONINI in the last 168 hours.  BNP (last 3 results) Recent Labs    07/24/21 2313  BNP 31.7    ProBNP (last 3 results) No results for input(s): PROBNP in the last 8760 hours.  CBG: No results for input(s): GLUCAP in the last 168 hours.  Radiological Exams on Admission: CT HEAD WO CONTRAST  Result Date: 07/24/2021 CLINICAL DATA:  Altered mental status, incontinence, seizure EXAM: CT HEAD WITHOUT CONTRAST TECHNIQUE: Contiguous axial images were obtained from the base of the skull through the vertex without intravenous contrast. COMPARISON:  None. FINDINGS: Brain: Normal anatomic configuration of the  brain. Age-indeterminate lacunar infarct within the posterior limb of the left internal capsule. No acute intracranial hemorrhage. No abnormal mass effect or midline shift. No abnormal intra or extra-axial mass lesion. Ventricular size is normal. Cerebellum is unremarkable. Vascular: No asymmetric hyperdense vasculature at the skull base. Skull: Intact Sinuses/Orbits: Bilateral maxillary antrectomy, turbinectomy, and partial ethmoidectomy has been performed. There is moderate residual mucosal thickening within the maxillary antra as well as within the residual ethmoid air cells. No air-fluid levels. Orbits are unremarkable. Other: Mastoid air cells and middle ear cavities are clear. IMPRESSION: Age-indeterminate lacunar infarct within the posterior limb of the left internal capsule. If indicated, this could be better aged utilizing MRI examination. No acute intracranial hemorrhage. Postsurgical changes within the paranasal sinuses,  as outlined above, with residual mild paranasal sinus disease. Electronically Signed   By: Helyn Numbers M.D.   On: 07/24/2021 23:00   DG Chest Port 1 View  Result Date: 07/24/2021 CLINICAL DATA:  Altered mental status. EXAM: PORTABLE CHEST 1 VIEW COMPARISON:  Chest x-ray 06/22/2011. FINDINGS: Markedly limited evaluation of the cardiomediastinal silhouette due to patient rotation. No focal consolidation. No pulmonary edema. No pleural effusion. No pneumothorax. No acute osseous abnormality. IMPRESSION: Markedly limited evaluation of the cardiomediastinal silhouette due to patient rotation. Recommend repeat frontal view of the chest. Electronically Signed   By: Tish Frederickson M.D.   On: 07/24/2021 23:52    EKG: I independently viewed the EKG done and my findings are as followed: Sinus rhythm rate of 97.  Prolonged PR interval.  Nonspecific ST-T changes.  QTc 464.  Assessment/Plan Present on Admission: **None**  Active Problems:   Seizure (HCC)  New onset seizure  activity Presented after being found unresponsive in his car by his neighbor with urine incontinence.  Oral trauma noted by EMS and patient appeared postictal.   While in the ED grand mal seizure witnessed. Was given IV Ativan, continue for breakthrough seizures EEG ordered Neurology consulted  Acute metabolic encephalopathy, postictal CT head showed age-indeterminate lacunar infarct within the posterior limb of the left internal capsule.  Consider MRI brain when patient is more stable. Reorient as needed N.p.o. until more alert  Age indeterminate lacunar infarct within the posterior limb of the left internal capsule seen on CT head.  Management per neurology PT OT assessment when more stable Fasting lipid panel, A1c  Type 2 diabetes complicated by hyperglycemia Serum glucose 561 on presentation with serum bicarb of 16 and anion gap of 18. Repeat BMP and beta hydroxybutyrate acid pending. Started on insulin sliding scale every 4 hours while NPO.  AKI, suspect prerenal in setting of dehydration Baseline creatinine appears to be 0.9 with GFR greater than 60 Presented with creatinine 1.97 with GFR 37 Start IV fluid hydration LR at 75 cc/h x 1 day Monitor urine output Avoid nephrotoxic agents, dehydration and hypotension Repeat BMP in the a.m.  Anion gap metabolic acidosis in the setting of acute renal failure Presented with serum bicarb of 16 with anion gap of 18 Continue IV fluid hydration  Resolved hypokalemia post repletion     DVT prophylaxis: Subcu Lovenox daily  Code Status: Full code  Family Communication: None at bedside.  Called his mother via phone x2.  No answer.  Left a voicemail message.  Disposition Plan: Admitted to progressive unit  Consults called: Neurology consulted by EDP  Admission status: Inpatient status.  Patient will require at least 2 midnights for further evaluation and treatment of present condition.   Status is:  Inpatient    Dispo:  Patient From:  Home  Planned Disposition:  Home  Medically stable for discharge:  No          Darlin Drop MD [facility] Pager [phone]  If 7PM-7AM, please contact night-coverage [email or web address] Password [facility]  07/25/2021, 2:19 AM

## 2021-07-25 NOTE — ED Notes (Signed)
Denture/ partial removed, in container at [facility]. Oral care provided. Declined chapstick

## 2021-07-25 NOTE — ED Notes (Signed)
Patient transported to MRI 

## 2021-07-25 NOTE — Plan of Care (Signed)
Patient seen by Dr. Thomasena Edis this morning. Provoked seizure in setting of hyperglycemia.  Patient back to baseline. MRI brain ordered to rule out acute process-reviewed-no acute changes. Medical management per primary team as you are. If he has another seizure, please recall.  At that time we will consider antiepileptics as well as EEG.  -- Milon Dikes, MD Neurologist [facility] Pager: [phone]

## 2021-07-25 NOTE — Progress Notes (Signed)
SLP Note:   Pt passed [facility] swallow screen; therefore, no SLP swallow eval is warranted per protocol. Confirmed with RN.  [name] L. Samson Frederic, MA CCC/SLP Acute Rehabilitation Services Office number [phone] Pager [phone]

## 2021-07-25 NOTE — Progress Notes (Signed)
Inpatient Diabetes Program Recommendations  AACE/ADA: New Consensus Statement on Inpatient Glycemic Control   Target Ranges:  Prepandial:   less than 140 mg/dL      Peak postprandial:   less than 180 mg/dL (1-2 hours)      Critically ill patients:  140 - 180 mg/dL   Results for JAKARIUS, FLAMENCO (MRN [id number]) as of 07/25/2021 08:28  Ref. Range 07/25/2021 02:30 07/25/2021 04:18 07/25/2021 06:07 07/25/2021 08:08  Glucose-Capillary Latest Ref Range: 70 - 99 mg/dL 657 (H) 846 (H) 962 (H) 256 (H)  Results for CEPHAS, REVARD (MRN [id number]) as of 07/25/2021 08:28  Ref. Range 07/24/2021 22:26 07/25/2021 00:17 07/25/2021 02:04  Beta-Hydroxybutyric Acid Latest Ref Range: 0.05 - 0.27 mmol/L 0.15    Glucose Latest Ref Range: 70 - 99 mg/dL 401 (HH) 027 (HH)   Hemoglobin A1C Latest Ref Range: 4.8 - 5.6 %   11.7 (H)    Review of Glycemic Control  Diabetes history: DM2 Outpatient Diabetes medications: prescribed Amaryl 4 mg daily, Metformin 1000 mg BID, Trulicity 0.75 mg Qweek (per [place] note by Charise Carwin on 05/25/21) Current orders for Inpatient glycemic control: Semglee 15 units daily, Novolog 0-20 units Q4H  Inpatient Diabetes Program Recommendations:    Insulin: Noted Semglee 15 units daily ordered today and to be started at 8 am today. Agree with current insulin orders.  NOTE: Noted consult for Diabetes Coordinator. Diabetes Coordinator is not on campus over the weekend but available by pager from 8am to 5pm for questions or concerns. Chart reviewed. Patient admitted with seizure and AMS after being found unresponsive in car by neighbor. Initial glucose 561 mg/dl on 2/53/66. Per Care Everywhere, patient was seen at [facility] on 05/25/21 and it was noted that patient was on Trulicity and had went into the doughnut hole and unable to afford; patient signed up for patient assistance. Will follow up with patient when appropriate.  Thanks, Orlando Penner, RN, MSN, CDE Diabetes Coordinator Inpatient Diabetes  Program [phone] (Team Pager from 8am to 5pm)

## 2021-07-26 ENCOUNTER — Inpatient Hospital Stay (HOSPITAL_COMMUNITY): Payer: Medicare HMO

## 2021-07-26 DIAGNOSIS — I10 Essential (primary) hypertension: Secondary | ICD-10-CM | POA: Diagnosis not present

## 2021-07-26 DIAGNOSIS — R569 Unspecified convulsions: Secondary | ICD-10-CM | POA: Diagnosis not present

## 2021-07-26 DIAGNOSIS — E1165 Type 2 diabetes mellitus with hyperglycemia: Secondary | ICD-10-CM | POA: Diagnosis not present

## 2021-07-26 LAB — BASIC METABOLIC PANEL
Anion gap: 10 (ref 5–15)
BUN: 9 mg/dL (ref 8–23)
CO2: 26 mmol/L (ref 22–32)
Calcium: 9.2 mg/dL (ref 8.9–10.3)
Chloride: 100 mmol/L (ref 98–111)
Creatinine, Ser: 1.48 mg/dL — ABNORMAL HIGH (ref 0.61–1.24)
GFR, Estimated: 52 mL/min — ABNORMAL LOW (ref >60)
Glucose, Bld: 347 mg/dL — ABNORMAL HIGH (ref 70–99)
Potassium: 3.8 mmol/L (ref 3.5–5.1)
Sodium: 136 mmol/L (ref 135–145)

## 2021-07-26 LAB — CBG MONITORING, ED
Glucose-Capillary: 214 mg/dL — ABNORMAL HIGH (ref 70–99)
Glucose-Capillary: 195 mg/dL — ABNORMAL HIGH (ref 70–99)
Glucose-Capillary: 343 mg/dL — ABNORMAL HIGH (ref 70–99)
Glucose-Capillary: 218 mg/dL — ABNORMAL HIGH (ref 70–99)
Glucose-Capillary: 218 mg/dL — ABNORMAL HIGH (ref 70–99)
Glucose-Capillary: 149 mg/dL — ABNORMAL HIGH (ref 70–99)

## 2021-07-26 MED ORDER — INSULIN ASPART 100 UNIT/ML IJ SOLN
0.0000 [IU] | Freq: Three times a day (TID) | INTRAMUSCULAR | Status: DC
  Administered 2021-07-26: 11 [IU] via SUBCUTANEOUS
  Administered 2021-07-26 – 2021-07-27 (×2): 5 [IU] via SUBCUTANEOUS
  Administered 2021-07-27: 3 [IU] via SUBCUTANEOUS
  Administered 2021-07-27 – 2021-07-28 (×2): 5 [IU] via SUBCUTANEOUS
  Administered 2021-07-28 (×2): 8 [IU] via SUBCUTANEOUS
  Administered 2021-07-29: 25 [IU] via SUBCUTANEOUS
  Administered 2021-07-29: 8 [IU] via SUBCUTANEOUS
  Administered 2021-07-29: 20 [IU] via SUBCUTANEOUS
  Administered 2021-07-30 (×2): 11 [IU] via SUBCUTANEOUS
  Administered 2021-07-30: 8 [IU] via SUBCUTANEOUS
  Administered 2021-07-31: 3 [IU] via SUBCUTANEOUS
  Administered 2021-07-31 (×2): 11 [IU] via SUBCUTANEOUS
  Administered 2021-08-01 (×2): 3 [IU] via SUBCUTANEOUS
  Administered 2021-08-01: 2 [IU] via SUBCUTANEOUS
  Administered 2021-08-02: 3 [IU] via SUBCUTANEOUS
  Administered 2021-08-02: 5 [IU] via SUBCUTANEOUS
  Administered 2021-08-02: 3 [IU] via SUBCUTANEOUS

## 2021-07-26 MED ORDER — INSULIN ASPART 100 UNIT/ML IJ SOLN: 0.0000 [IU] | Freq: Three times a day (TID) | INTRAMUSCULAR | Status: DC

## 2021-07-26 MED ORDER — INSULIN ASPART 100 UNIT/ML IJ SOLN
0.0000 [IU] | Freq: Every day | INTRAMUSCULAR | Status: DC
  Administered 2021-07-27 – 2021-07-28 (×2): 2 [IU] via SUBCUTANEOUS
  Administered 2021-07-29: 4 [IU] via SUBCUTANEOUS
  Administered 2021-07-31: 3 [IU] via SUBCUTANEOUS
  Administered 2021-08-01: 4 [IU] via SUBCUTANEOUS

## 2021-07-26 MED ORDER — INSULIN ASPART PROT & ASPART (70-30 MIX) 100 UNIT/ML ~~LOC~~ SUSP
14.0000 [IU] | Freq: Two times a day (BID) | SUBCUTANEOUS | Status: DC
  Administered 2021-07-26: 14 [IU] via SUBCUTANEOUS
  Filled 2021-07-26 (×3): qty 10

## 2021-07-26 MED ORDER — INSULIN GLARGINE-YFGN 100 UNIT/ML ~~LOC~~ SOLN: 13.0000 [IU] | Freq: Every day | SUBCUTANEOUS | Status: DC

## 2021-07-26 NOTE — Progress Notes (Signed)
Patient transferred from ED to 5W10. Patient is alert and oriented to person, place, time, and situation. Telemetry monitoring enabled, vital signs taken, and IV assessed for patency. Skin checked with Kennyth Arnold RN and Tamika RN. Fall precautions initiated. Patient bed in the locked, lowest position. Non-slip socks in place and bed alarm on. Call bell is within reach. Patient knows to call for assistance prior to getting up and patient demonstrates use. Patient is laying comfortably in bed.

## 2021-07-26 NOTE — ED Notes (Signed)
Toileting offered. Pt declines saying he has a urinal and "doesn't need to poop". Pt repositioned. Seizure pads remain in place.   Pt upset about pants being cut off on his arrival to ER demanding a new pair.

## 2021-07-26 NOTE — Progress Notes (Signed)
Inpatient Diabetes Program Recommendations  AACE/ADA: New Consensus Statement on Inpatient Glycemic Control (2015)  Target Ranges:  Prepandial:   less than 140 mg/dL      Peak postprandial:   less than 180 mg/dL (1-2 hours)      Critically ill patients:  140 - 180 mg/dL   Lab Results  Component Value Date   GLUCAP 343 (H) 07/26/2021   HGBA1C 11.7 (H) 07/25/2021    Review of Glycemic Control Results for KATSUMI, WISLER (MRN [id number]) as of 07/26/2021 13:26  Ref. Range 07/26/2021 05:55 07/26/2021 08:31 07/26/2021 12:25  Glucose-Capillary Latest Ref Range: 70 - 99 mg/dL 627 (H) 035 (H) 009 (H)   Diabetes history: Type 2 DM Outpatient Diabetes medications: Amaryl 4 mg QD, Metformin 1000 mg bid, Trulicity 0.75 mg qwk Current orders for Inpatient glycemic control: Semglee 13 units QD, Novolog 0-15 units TID & HS  Inpatient Diabetes Program Recommendations:    Consider adding Novolog 70/30 14 units BID. Attempted to speak with patient x 2. Will reattempt.  Apparently, patient had been in doughnut hole and could not afford medications. Will place [facility] consult as it looks patient has been seen through the [place].  Thanks, Lujean Rave, MSN, RNC-OB Diabetes Coordinator [phone] (8a-5p)

## 2021-07-26 NOTE — Progress Notes (Signed)
Progress Note    Wayne Wood  ZOX:[id number] DOB: 11-Nov-1955  DOA: 07/24/2021 PCP: Raymon Mutton., FNP    Brief Narrative:     Medical records reviewed and are as summarized below:  Wayne Wood is an 65 y.o. male with medical history significant for essential hypertension, hyperlipidemia, GERD, type 2 diabetes, obesity, who presented to [facility] ED due to altered mental status and suspected seizure.  Spoke with neurology who thinks provoked seizures from hyperglycemia.    Assessment/Plan:   Active Problems:   Essential hypertension   Seizure (HCC)   DM (diabetes mellitus), type 2, uncontrolled (HCC)   New onset seizure activity -neurology who thinks provoked seizures from hyperglycemia.   -consider antiepileptics as well as EEG if he has another seizure -MRI negative for acute issues   Acute metabolic encephalopathy, postictal -appears to be at baseline   Type 2 diabetes complicated by hyperglycemia Lantus plus SSI   AKI, suspect prerenal in setting of dehydration (last CR was 10 years ago in our system) Baseline creatinine appears to be 0.9 with GFR greater than 60 Presented with creatinine 1.97 with GFR 37 IVF and daily labs   Anion gap metabolic acidosis in the setting of acute renal failure -resolved     Family Communication/Anticipated D/C date and plan/Code Status   DVT prophylaxis: Lovenox ordered. Code Status: Full Code.  Disposition Plan: Status is: Inpatient  Remains inpatient appropriate because:Inpatient level of care appropriate due to severity of illness  Dispo:  Patient From: Home  Planned Disposition: Home  Medically stable for discharge: No           Medical Consultants:   Neuro (signed off: provoked seizure- re consult if another occurs)    Subjective:   No overnight events- still in ER awaiting bed  Objective:    Vitals:   07/26/21 0800 07/26/21 0824 07/26/21 0826 07/26/21 0900  BP: 139/72  139/72 137/76   Pulse: 81  82 87  Resp: (!) 25   (!) 21  Temp:  98.8 F (37.1 C)    TempSrc:  Oral    SpO2: 99%   99%    Intake/Output Summary (Last 24 hours) at 07/26/2021 1000 Last data filed at 07/26/2021 8119 Gross per 24 hour  Intake 1000 ml  Output 700 ml  Net 300 ml   There were no vitals filed for this visit.  Exam:  General: Appearance:    Obese male in no acute distress     Lungs:     respirations unlabored  Heart:    Normal heart rate.   MS:   All extremities are intact.    Neurologic:   Awake, alert     Data Reviewed:   I have personally reviewed following labs and imaging studies:  Labs: Labs show the following:   Basic Metabolic Panel: Recent Labs  Lab 07/24/21 2226 07/25/21 0017 07/25/21 0132 07/25/21 0204 07/25/21 0420  NA 132* 136  135 134*  --  134*  K 4.1 3.3*  3.3* 4.0  --  4.1  CL 98 100  --   --  101  CO2 16*  --   --   --  22  GLUCOSE 561* 577*  --   --  507*  BUN 13 16  --   --  13  CREATININE 1.97* 1.70*  --  1.66* 1.62*  CALCIUM 9.4  --   --   --  9.1  MG 2.0  --   --   --   --  GFR CrCl cannot be calculated (Unknown ideal weight.). Liver Function Tests: Recent Labs  Lab 07/24/21 2226  AST 27  ALT 22  ALKPHOS 100  BILITOT 0.6  PROT 7.1  ALBUMIN 3.4*   No results for input(s): LIPASE, AMYLASE in the last 168 hours. Recent Labs  Lab 07/24/21 2310  AMMONIA 60*   Coagulation profile Recent Labs  Lab 07/24/21 2226  INR 1.0    CBC: Recent Labs  Lab 07/24/21 2226 07/25/21 0017 07/25/21 0132 07/25/21 0204 07/25/21 0549  WBC 10.7*  --   --  11.7* 11.7*  NEUTROABS 7.2  --   --   --   --   HGB 13.0 14.6  14.3 12.2* 12.5* 12.5*  HCT 38.8* 43.0  42.0 36.0* 37.5* 37.6*  MCV 91.5  --   --  89.5 90.0  PLT 229  --   --  226 227   Cardiac Enzymes: No results for input(s): CKTOTAL, CKMB, CKMBINDEX, TROPONINI in the last 168 hours. BNP (last 3 results) No results for input(s): PROBNP in the last 8760 hours. CBG: Recent  Labs  Lab 07/25/21 1506 07/25/21 1615 07/25/21 2257 07/26/21 0555 07/26/21 0831  GLUCAP 110* 186* 275* 214* 195*   D-Dimer: No results for input(s): DDIMER in the last 72 hours. Hgb A1c: Recent Labs    07/25/21 0204  HGBA1C 11.7*   Lipid Profile: Recent Labs    07/25/21 0420  CHOL 194  HDL 43  LDLCALC 118*  TRIG 163*  CHOLHDL 4.5   Thyroid function studies: No results for input(s): TSH, T4TOTAL, T3FREE, THYROIDAB in the last 72 hours.  Invalid input(s): FREET3 Anemia work up: No results for input(s): VITAMINB12, FOLATE, FERRITIN, TIBC, IRON, RETICCTPCT in the last 72 hours. Sepsis Labs: Recent Labs  Lab 07/24/21 2226 07/25/21 0204 07/25/21 0549  WBC 10.7* 11.7* 11.7*    Microbiology Recent Results (from the past 240 hour(s))  Resp Panel by RT-PCR (Flu A&B, Covid) Nasopharyngeal Swab     Status: None   Collection Time: 07/24/21 10:26 PM   Specimen: Nasopharyngeal Swab; Nasopharyngeal(NP) swabs in vial transport medium  Result Value Ref Range Status   SARS Coronavirus 2 by RT PCR NEGATIVE NEGATIVE Final    Comment: (NOTE) SARS-CoV-2 target nucleic acids are NOT DETECTED.  The SARS-CoV-2 RNA is generally detectable in upper respiratory specimens during the acute phase of infection. The lowest concentration of SARS-CoV-2 viral copies this assay can detect is 138 copies/mL. A negative result does not preclude SARS-Cov-2 infection and should not be used as the sole basis for treatment or other patient management decisions. A negative result may occur with  improper specimen collection/handling, submission of specimen other than nasopharyngeal swab, presence of viral mutation(s) within the areas targeted by this assay, and inadequate number of viral copies(<138 copies/mL). A negative result must be combined with clinical observations, patient history, and epidemiological information. The expected result is Negative.  Fact Sheet for Patients:   BloggerCourse.com  Fact Sheet for Healthcare Providers:  SeriousBroker.it  This test is no t yet approved or cleared by the [place] FDA and  has been authorized for detection and/or diagnosis of SARS-CoV-2 by FDA under an Emergency Use Authorization (EUA). This EUA will remain  in effect (meaning this test can be used) for the duration of the COVID-19 declaration under Section 564(b)(1) of the Act, 21 U.S.C.section 360bbb-3(b)(1), unless the authorization is terminated  or revoked sooner.       Influenza A by PCR NEGATIVE NEGATIVE Final  Influenza B by PCR NEGATIVE NEGATIVE Final    Comment: (NOTE) The Xpert Xpress SARS-CoV-2/FLU/RSV plus assay is intended as an aid in the diagnosis of influenza from Nasopharyngeal swab specimens and should not be used as a sole basis for treatment. Nasal washings and aspirates are unacceptable for Xpert Xpress SARS-CoV-2/FLU/RSV testing.  Fact Sheet for Patients: BloggerCourse.com  Fact Sheet for Healthcare Providers: SeriousBroker.it  This test is not yet approved or cleared by the [place] FDA and has been authorized for detection and/or diagnosis of SARS-CoV-2 by FDA under an Emergency Use Authorization (EUA). This EUA will remain in effect (meaning this test can be used) for the duration of the COVID-19 declaration under Section 564(b)(1) of the Act, 21 U.S.C. section 360bbb-3(b)(1), unless the authorization is terminated or revoked.  Performed at [facility], [address] [place], [place] [address]     Procedures and diagnostic studies:  CT HEAD WO CONTRAST  Result Date: 07/24/2021 CLINICAL DATA:  Altered mental status, incontinence, seizure EXAM: CT HEAD WITHOUT CONTRAST TECHNIQUE: Contiguous axial images were obtained from the base of the skull through the vertex without intravenous contrast. COMPARISON:   None. FINDINGS: Brain: Normal anatomic configuration of the brain. Age-indeterminate lacunar infarct within the posterior limb of the left internal capsule. No acute intracranial hemorrhage. No abnormal mass effect or midline shift. No abnormal intra or extra-axial mass lesion. Ventricular size is normal. Cerebellum is unremarkable. Vascular: No asymmetric hyperdense vasculature at the skull base. Skull: Intact Sinuses/Orbits: Bilateral maxillary antrectomy, turbinectomy, and partial ethmoidectomy has been performed. There is moderate residual mucosal thickening within the maxillary antra as well as within the residual ethmoid air cells. No air-fluid levels. Orbits are unremarkable. Other: Mastoid air cells and middle ear cavities are clear. IMPRESSION: Age-indeterminate lacunar infarct within the posterior limb of the left internal capsule. If indicated, this could be better aged utilizing MRI examination. No acute intracranial hemorrhage. Postsurgical changes within the paranasal sinuses, as outlined above, with residual mild paranasal sinus disease. Electronically Signed   By: Helyn Numbers M.D.   On: 07/24/2021 23:00   MR BRAIN WO CONTRAST  Result Date: 07/25/2021 CLINICAL DATA:  65 year old male with unexplained altered mental status. Age indeterminate left internal capsule lacunar infarct on head CT yesterday. EXAM: MRI HEAD WITHOUT CONTRAST TECHNIQUE: Multiplanar, multiecho pulse sequences of the brain and surrounding structures were obtained without intravenous contrast. COMPARISON:  Head CT 07/24/2021. FINDINGS: Brain: No restricted diffusion to suggest acute infarction. No midline shift, mass effect, evidence of mass lesion, ventriculomegaly, extra-axial collection or acute intracranial hemorrhage. Cervicomedullary junction and pituitary are within normal limits. Small chronic lacunar infarct of the medial left thalamus. Additional mild T2 and FLAIR heterogeneity in the basal ganglia/internal capsule  region more resemble perivascular spaces than chronic ischemia. Elsewhere Wallace Cullens and white matter signal is within normal limits for age throughout the brain. No cortical encephalomalacia. No chronic cerebral blood products. Cerebral volume is within normal limits for age. Vascular: Major intracranial vascular flow voids are preserved. Skull and upper cervical spine: Mild cervical disc degeneration. Visualized bone marrow signal is within normal limits. Sinuses/Orbits: Negative orbits. Bilateral paranasal sinus mucoperiosteal thickening is stable. Other: Mastoids are clear. Grossly normal visible internal auditory structures. Negative visible scalp and face. IMPRESSION: 1. No acute intracranial abnormality. 2. 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 Otherwise normal for age noncontrast MRI appearance of the brain. 3. Chronic paranasal sinus disease. Electronically Signed   By: Odessa Fleming M.D.   On:  07/25/2021 04:05   DG Chest Port 1 View  Result Date: 07/24/2021 CLINICAL DATA:  Altered mental status. EXAM: PORTABLE CHEST 1 VIEW COMPARISON:  Chest x-ray 06/22/2011. FINDINGS: Markedly limited evaluation of the cardiomediastinal silhouette due to patient rotation. No focal consolidation. No pulmonary edema. No pleural effusion. No pneumothorax. No acute osseous abnormality. IMPRESSION: Markedly limited evaluation of the cardiomediastinal silhouette due to patient rotation. Recommend repeat frontal view of the chest. Electronically Signed   By: Tish Frederickson M.D.   On: 07/24/2021 23:52    Medications:    enoxaparin (LOVENOX) injection  40 mg Subcutaneous Daily   folic acid  1 mg Oral Daily   insulin aspart  0-20 Units Subcutaneous Q4H   insulin glargine-yfgn  10 Units Subcutaneous Daily   multivitamin with minerals  1 tablet Oral Daily   thiamine  100 mg Oral Daily   Or   thiamine  100 mg Intravenous Daily   Continuous Infusions:     LOS: 1 day   Joseph Art  [facility]   How to  contact the [facility] Attending or Consulting provider 7A - 7P or covering provider during after hours 7P -7A, for this patient?  Check the care team in [facility] and look for a) attending/consulting TRH provider listed and b) the [facility] team listed Log into [email or web address] and use [place]'s universal password to access. If you do not have the password, please contact the hospital operator. Locate the [facility] provider you are looking for under [facility] and page to a number that you can be directly reached. If you still have difficulty reaching the provider, please page the [facility] (Director on Call) for the Hospitalists listed on amion for assistance.  07/26/2021, 10:00 AM

## 2021-07-27 ENCOUNTER — Inpatient Hospital Stay (HOSPITAL_COMMUNITY): Payer: Medicare HMO

## 2021-07-27 ENCOUNTER — Encounter (HOSPITAL_COMMUNITY): Payer: Self-pay | Admitting: Internal Medicine

## 2021-07-27 DIAGNOSIS — M25572 Pain in left ankle and joints of left foot: Secondary | ICD-10-CM

## 2021-07-27 DIAGNOSIS — R569 Unspecified convulsions: Secondary | ICD-10-CM | POA: Diagnosis not present

## 2021-07-27 DIAGNOSIS — E1165 Type 2 diabetes mellitus with hyperglycemia: Secondary | ICD-10-CM | POA: Diagnosis not present

## 2021-07-27 LAB — GLUCOSE, CAPILLARY
Glucose-Capillary: 228 mg/dL — ABNORMAL HIGH (ref 70–99)
Glucose-Capillary: 202 mg/dL — ABNORMAL HIGH (ref 70–99)
Glucose-Capillary: 189 mg/dL — ABNORMAL HIGH (ref 70–99)
Glucose-Capillary: 219 mg/dL — ABNORMAL HIGH (ref 70–99)

## 2021-07-27 LAB — BASIC METABOLIC PANEL
Anion gap: 9 (ref 5–15)
BUN: 7 mg/dL — ABNORMAL LOW (ref 8–23)
CO2: 27 mmol/L (ref 22–32)
Calcium: 9.2 mg/dL (ref 8.9–10.3)
Chloride: 101 mmol/L (ref 98–111)
Creatinine, Ser: 1.4 mg/dL — ABNORMAL HIGH (ref 0.61–1.24)
GFR, Estimated: 56 mL/min — ABNORMAL LOW (ref >60)
Glucose, Bld: 168 mg/dL — ABNORMAL HIGH (ref 70–99)
Potassium: 3.4 mmol/L — ABNORMAL LOW (ref 3.5–5.1)
Sodium: 137 mmol/L (ref 135–145)

## 2021-07-27 LAB — URIC ACID: Uric Acid, Serum: 6 mg/dL (ref 3.7–8.6)

## 2021-07-27 MED ORDER — SIMVASTATIN 20 MG PO TABS
40.0000 mg | ORAL_TABLET | Freq: Every day | ORAL | Status: DC
  Administered 2021-07-27 – 2021-08-01 (×6): 40 mg via ORAL
  Filled 2021-07-27 (×6): qty 2

## 2021-07-27 MED ORDER — TAMSULOSIN HCL 0.4 MG PO CAPS
0.4000 mg | ORAL_CAPSULE | Freq: Every day | ORAL | Status: DC
  Administered 2021-07-27 – 2021-08-02 (×7): 0.4 mg via ORAL
  Filled 2021-07-27 (×7): qty 1

## 2021-07-27 MED ORDER — POTASSIUM CHLORIDE CRYS ER 20 MEQ PO TBCR
40.0000 meq | EXTENDED_RELEASE_TABLET | Freq: Once | ORAL | Status: AC
  Administered 2021-07-27: 40 meq via ORAL
  Filled 2021-07-27: qty 2

## 2021-07-27 MED ORDER — LIDOCAINE 5 % EX PTCH
1.0000 | MEDICATED_PATCH | CUTANEOUS | Status: DC
  Administered 2021-07-27 – 2021-08-02 (×7): 1 via TRANSDERMAL
  Filled 2021-07-27 (×7): qty 1

## 2021-07-27 MED ORDER — INSULIN ASPART PROT & ASPART (70-30 MIX) 100 UNIT/ML ~~LOC~~ SUSP
16.0000 [IU] | Freq: Two times a day (BID) | SUBCUTANEOUS | Status: DC
  Administered 2021-07-27 – 2021-07-28 (×2): 16 [IU] via SUBCUTANEOUS

## 2021-07-27 MED ORDER — OXYCODONE HCL 5 MG PO TABS
5.0000 mg | ORAL_TABLET | Freq: Four times a day (QID) | ORAL | Status: DC | PRN
  Filled 2021-07-27: qty 1

## 2021-07-27 MED ORDER — PANTOPRAZOLE SODIUM 40 MG PO TBEC
40.0000 mg | DELAYED_RELEASE_TABLET | Freq: Every day | ORAL | Status: DC
  Administered 2021-07-27 – 2021-08-02 (×7): 40 mg via ORAL
  Filled 2021-07-27 (×7): qty 1

## 2021-07-27 MED ORDER — LATANOPROST 0.005 % OP SOLN
1.0000 [drp] | Freq: Every morning | OPHTHALMIC | Status: DC
  Administered 2021-07-27 – 2021-08-02 (×7): 1 [drp] via OPHTHALMIC
  Filled 2021-07-27: qty 2.5

## 2021-07-27 NOTE — Progress Notes (Signed)
Progress Note    Wayne Wood  BSJ:[id number] DOB: [birth year]  DOA: 07/24/2021 PCP: Raymon Mutton., FNP    Brief Narrative:     Medical records reviewed and are as summarized below:  Wayne Wood is an 65 y.o. male with medical history significant for essential hypertension, hyperlipidemia, GERD, type 2 diabetes, obesity, who presented to [facility] ED due to altered mental status and suspected seizure.  Spoke with neurology who thinks provoked seizures from hyperglycemia.    Assessment/Plan:   Active Problems:   Essential hypertension   Seizure (HCC)   DM (diabetes mellitus), type 2, uncontrolled (HCC)   New onset seizure activity -neurology who thinks provoked seizures from hyperglycemia.   -consider antiepileptics as well as EEG if he has another seizure -MRI negative for acute issues   Ankle pain -uric acid negative -lidocaine patch, PRN oxy -x ray shows osteoarthritis   Acute metabolic encephalopathy, postictal -appears to be at baseline   Type 2 diabetes complicated by hyperglycemia -Apparently, patient had been in doughnut hole and could not afford medications -discussed with diabetic coordinator -stared 70/30- adjust as needed -most recent HgbA1c 11.7   AKI, suspect prerenal in setting of dehydration (last CR was 10 years ago in our system) Baseline creatinine appears to be 0.9 with GFR greater than 60 Presented with creatinine 1.97 with GFR 37 -BMP in AM   Hypokalemia -replete  Anion gap metabolic acidosis in the setting of acute renal failure -resolved  PT eval-- recommend SNF   Family Communication/Anticipated D/C date and plan/Code Status   DVT prophylaxis: Lovenox ordered. Code Status: Full Code.  Disposition Plan: Status is: Inpatient  Remains inpatient appropriate because:Inpatient level of care appropriate due to severity of illness  Dispo:  Patient From: Home  Planned Disposition: Home  Medically stable for discharge:  No           Medical Consultants:   Neuro (signed off: provoked seizure- re consult if another occurs)    Subjective:   Wants to go home  Objective:    Vitals:   07/27/21 0400 07/27/21 0722 07/27/21 0800 07/27/21 1129  BP: 125/68 (!) 145/60 125/69 (!) 146/71  Pulse: 95 95 94 89  Resp:  20  (!) 23  Temp:  98.7 F (37.1 C)  98.8 F (37.1 C)  TempSrc:  Oral  Oral  SpO2:  94%  93%  Weight:      Height:        Intake/Output Summary (Last 24 hours) at 07/27/2021 1450 Last data filed at 07/27/2021 1300 Gross per 24 hour  Intake 120 ml  Output 250 ml  Net -130 ml   Filed Weights   07/27/21 0312  Weight: 103.4 kg    Exam:   General: Appearance:    Obese male in no acute distress     Lungs:      respirations unlabored  Heart:    Normal heart rate. Normal rhythm. No murmurs, rubs, or gallops.    MS:   All extremities are intact.    Neurologic:   Awake, alert, poor insight into disease processes        Data Reviewed:   I have personally reviewed following labs and imaging studies:  Labs: Labs show the following:   Basic Metabolic Panel: Recent Labs  Lab 07/24/21 2226 07/25/21 0017 07/25/21 0132 07/25/21 0204 07/25/21 0420 07/26/21 1346 07/27/21 0105  NA 132* 136  135 134*  --  134* 136 137  K 4.1 3.3*  3.3* 4.0  --  4.1 3.8 3.4*  CL 98 100  --   --  101 100 101  CO2 16*  --   --   --  22 26 27   GLUCOSE 561* 577*  --   --  507* 347* 168*  BUN 13 16  --   --  13 9 7*  CREATININE 1.97* 1.70*  --  1.66* 1.62* 1.48* 1.40*  CALCIUM 9.4  --   --   --  9.1 9.2 9.2  MG 2.0  --   --   --   --   --   --    GFR Estimated Creatinine Clearance: 63.4 mL/min (A) (by C-G formula based on SCr of 1.4 mg/dL (H)). Liver Function Tests: Recent Labs  Lab 07/24/21 2226  AST 27  ALT 22  ALKPHOS 100  BILITOT 0.6  PROT 7.1  ALBUMIN 3.4*   No results for input(s): LIPASE, AMYLASE in the last 168 hours. Recent Labs  Lab 07/24/21 2310  AMMONIA 60*    Coagulation profile Recent Labs  Lab 07/24/21 2226  INR 1.0    CBC: Recent Labs  Lab 07/24/21 2226 07/25/21 0017 07/25/21 0132 07/25/21 0204 07/25/21 0549  WBC 10.7*  --   --  11.7* 11.7*  NEUTROABS 7.2  --   --   --   --   HGB 13.0 14.6  14.3 12.2* 12.5* 12.5*  HCT 38.8* 43.0  42.0 36.0* 37.5* 37.6*  MCV 91.5  --   --  89.5 90.0  PLT 229  --   --  226 227   Cardiac Enzymes: No results for input(s): CKTOTAL, CKMB, CKMBINDEX, TROPONINI in the last 168 hours. BNP (last 3 results) No results for input(s): PROBNP in the last 8760 hours. CBG: Recent Labs  Lab 07/26/21 1625 07/26/21 1950 07/26/21 2155 07/27/21 0721 07/27/21 1132  GLUCAP 218* 218* 149* 228* 202*   D-Dimer: No results for input(s): DDIMER in the last 72 hours. Hgb A1c: Recent Labs    07/25/21 0204  HGBA1C 11.7*   Lipid Profile: Recent Labs    07/25/21 0420  CHOL 194  HDL 43  LDLCALC 118*  TRIG 163*  CHOLHDL 4.5   Thyroid function studies: No results for input(s): TSH, T4TOTAL, T3FREE, THYROIDAB in the last 72 hours.  Invalid input(s): FREET3 Anemia work up: No results for input(s): VITAMINB12, FOLATE, FERRITIN, TIBC, IRON, RETICCTPCT in the last 72 hours. Sepsis Labs: Recent Labs  Lab 07/24/21 2226 07/25/21 0204 07/25/21 0549  WBC 10.7* 11.7* 11.7*    Microbiology Recent Results (from the past 240 hour(s))  Resp Panel by RT-PCR (Flu A&B, Covid) Nasopharyngeal Swab     Status: None   Collection Time: 07/24/21 10:26 PM   Specimen: Nasopharyngeal Swab; Nasopharyngeal(NP) swabs in vial transport medium  Result Value Ref Range Status   SARS Coronavirus 2 by RT PCR NEGATIVE NEGATIVE Final    Comment: (NOTE) SARS-CoV-2 target nucleic acids are NOT DETECTED.  The SARS-CoV-2 RNA is generally detectable in upper respiratory specimens during the acute phase of infection. The lowest concentration of SARS-CoV-2 viral copies this assay can detect is 138 copies/mL. A negative result  does not preclude SARS-Cov-2 infection and should not be used as the sole basis for treatment or other patient management decisions. A negative result may occur with  improper specimen collection/handling, submission of specimen other than nasopharyngeal swab, presence of viral mutation(s) within the areas targeted by this assay, and inadequate  number of viral copies(<138 copies/mL). A negative result must be combined with clinical observations, patient history, and epidemiological information. The expected result is Negative.  Fact Sheet for Patients:  BloggerCourse.com  Fact Sheet for Healthcare Providers:  SeriousBroker.it  This test is no t yet approved or cleared by the [place] FDA and  has been authorized for detection and/or diagnosis of SARS-CoV-2 by FDA under an Emergency Use Authorization (EUA). This EUA will remain  in effect (meaning this test can be used) for the duration of the COVID-19 declaration under Section 564(b)(1) of the Act, 21 U.S.C.section 360bbb-3(b)(1), unless the authorization is terminated  or revoked sooner.       Influenza A by PCR NEGATIVE NEGATIVE Final   Influenza B by PCR NEGATIVE NEGATIVE Final    Comment: (NOTE) The Xpert Xpress SARS-CoV-2/FLU/RSV plus assay is intended as an aid in the diagnosis of influenza from Nasopharyngeal swab specimens and should not be used as a sole basis for treatment. Nasal washings and aspirates are unacceptable for Xpert Xpress SARS-CoV-2/FLU/RSV testing.  Fact Sheet for Patients: BloggerCourse.com  Fact Sheet for Healthcare Providers: SeriousBroker.it  This test is not yet approved or cleared by the [place] FDA and has been authorized for detection and/or diagnosis of SARS-CoV-2 by FDA under an Emergency Use Authorization (EUA). This EUA will remain in effect (meaning this test can be used) for  the duration of the COVID-19 declaration under Section 564(b)(1) of the Act, 21 U.S.C. section 360bbb-3(b)(1), unless the authorization is terminated or revoked.  Performed at [facility], [address], [place], [place] [address]     Procedures and diagnostic studies:  DG Ankle 2 Views Left  Result Date: 07/27/2021 CLINICAL DATA:  Ankle pain EXAM: LEFT ANKLE - 2 VIEW COMPARISON:  02/09/2010 FINDINGS: Mild left ankle and left hindfoot osteoarthrosis. No acute fracture or dislocation. IMPRESSION: Mild left ankle and hindfoot osteoarthrosis. Electronically Signed   By: Deatra Robinson M.D.   On: 07/27/2021 00:15   DG CHEST PORT 1 VIEW  Result Date: 07/27/2021 CLINICAL DATA:  Fever EXAM: PORTABLE CHEST 1 VIEW COMPARISON:  07/24/2021 FINDINGS: Prior median sternotomy. Heart and mediastinal contours are within normal limits. No focal opacities or effusions. No acute bony abnormality. IMPRESSION: No active disease. Electronically Signed   By: Charlett Nose M.D.   On: 07/27/2021 09:07    Medications:    enoxaparin (LOVENOX) injection  40 mg Subcutaneous Daily   folic acid  1 mg Oral Daily   insulin aspart  0-15 Units Subcutaneous TID WC   insulin aspart  0-5 Units Subcutaneous QHS   insulin aspart protamine- aspart  16 Units Subcutaneous BID WC   latanoprost  1 drop Both Eyes q morning   lidocaine  1 patch Transdermal Q24H   multivitamin with minerals  1 tablet Oral Daily   pantoprazole  40 mg Oral Daily   simvastatin  40 mg Oral QHS   tamsulosin  0.4 mg Oral Daily   thiamine  100 mg Oral Daily   Continuous Infusions:     LOS: 2 days   Joseph Art  [facility]   How to contact the [facility] Attending or Consulting provider 7A - 7P or covering provider during after hours 7P -7A, for this patient?  Check the care team in [facility] and look for a) attending/consulting TRH provider listed and b) the [facility] team listed Log into [email or web address] and use [place]'s universal password  to access. If you do not  have the password, please contact the hospital operator. Locate the [facility] provider you are looking for under [facility] and page to a number that you can be directly reached. If you still have difficulty reaching the provider, please page the [facility] (Director on Call) for the Hospitalists listed on amion for assistance.  07/27/2021, 2:50 PM

## 2021-07-27 NOTE — TOC Initial Note (Signed)
Transition of Care [facility]) - Initial/Assessment Note    Patient Details  Name: Wayne Wood MRN: [id number] Date of Birth: April 11, 1956  Transition of Care [facility]) CM/SW Contact:    Lawerance Sabal, RN Phone Number: 07/27/2021, 11:45 AM  Clinical Narrative:        Patient admitted from home, lives alone.  Admitted w seizure and confusion, likely related to hyperglycemia.    Reports being independent PTA and working as an Event organiser. States that he loves it. He states that he has a single point and 4 point cane at home. We discussed PT recs for SNF and he is currently refusing SNF, he is agreeable to [facility] services, without preference of provider.  We discussed that he will need to climb 3 flights of stairs to get into his apartment. Patient shows disinterest in conversation.  Will continue to follow for DME recs closer to [place]               Patient Goals and CMS Choice        Expected Discharge Plan and Services                                                Prior Living Arrangements/Services                       Activities of Daily Living Home Assistive Devices/Equipment: Dentures (specify type) (partial) ADL Screening (condition at time of admission) Patient's cognitive ability adequate to safely complete daily activities?: Yes Is the patient deaf or have difficulty hearing?: No Does the patient have difficulty seeing, even when wearing glasses/contacts?: No Does the patient have difficulty concentrating, remembering, or making decisions?: No Patient able to express need for assistance with ADLs?: Yes Does the patient have difficulty dressing or bathing?: Yes Independently performs ADLs?: No Communication: Independent Dressing (OT): Needs assistance Is this a change from baseline?: Change from baseline, expected to last <3days Grooming: Needs assistance Is this a change from baseline?: Change from baseline, expected to last <3  days Feeding: Independent Bathing: Needs assistance Is this a change from baseline?: Change from baseline, expected to last <3 days Toileting: Needs assistance Is this a change from baseline?: Change from baseline, expected to last <3 days In/Out Bed: Dependent Is this a change from baseline?: Change from baseline, expected to last <3 days Walks in Home: Independent Does the patient have difficulty walking or climbing stairs?: No Weakness of Legs: Both Weakness of Arms/Hands: None  Permission Sought/Granted                  Emotional Assessment              Admission diagnosis:  Seizure (HCC) [R56.9] Fever [R50.9] Altered mental status, unspecified altered mental status type [R41.82] Patient Active Problem List   Diagnosis Date Noted   Seizure (HCC) 07/25/2021   DM (diabetes mellitus), type 2, uncontrolled (HCC) 07/25/2021   Chronic congestive heart failure (HCC) 07/12/2017   Essential hypertension 07/12/2017   Diabetes mellitus without complication (HCC) 07/12/2017   Chronic gout of foot 07/12/2017   Plantar fasciitis 07/12/2017   PCP:  Raymon Mutton., FNP Pharmacy:   [facility] [phone] - [place], [place] - [address] [place] [address] Phone: [phone] Fax: [phone]  [facility] - REFILLS ONLY - [place], [place] - [address] [place] [address] Phone: [phone] Fax: [phone]     Social Determinants of Health (SDOH) Interventions    Readmission Risk Interventions No flowsheet data found.

## 2021-07-27 NOTE — Evaluation (Signed)
Occupational Therapy Evaluation Patient Details Name: Wayne Wood MRN: [id number] DOB: August 05, 1956 Today's Date: 07/27/2021   History of Present Illness Pt is a 65 yo male who presented to [facility] ED due to altered mental status and suspected seizure thought to be from hyperglycemia. PMH includes essential hypertension, hyperlipidemia, GERD, type 2 diabetes, obesity.   Clinical Impression   Pt admitted with the above diagnoses and presents with below problem list. Pt will benefit from continued acute OT to address the below listed deficits and maximize independence with basic ADLs prior to d/c to venue below. PTA, pt was living alone, works as an Scientist, research (medical) and lives in a 3rd floor apartment with no elevator access; unable to identify anyone who could assist at home. He endorses "seldom" use of a cane. Today, pt needs max A +2 for LB ADLs in sit<>stand, heavy max A +1 (recommend +2 assist) to stand 1x from EOB. Pt limited by L ankle edema and pain 8/10 FACEs in weightbearing (standing position). Pt also noted to have decreased insight into current deficits, decreased safety awareness, and some impulsivity.        Recommendations for follow up therapy are one component of a multi-disciplinary discharge planning process, led by the attending physician.  Recommendations may be updated based on patient status, additional functional criteria and insurance authorization.   Follow Up Recommendations  SNF    Equipment Recommendations  Other (comment) (defer to next venue)    Recommendations for Other Services PT consult     Precautions / Restrictions Precautions Precautions: Fall;Other (comment) (h/o recent seizures) Restrictions Weight Bearing Restrictions: No Other Position/Activity Restrictions: painful and swollen L ankle with limited AROM      Mobility Bed Mobility Overal bed mobility: Needs Assistance Bed Mobility: Supine to Sit;Sit to Supine     Supine to sit: Mod  assist Sit to supine: Mod assist   General bed mobility comments: effortful, + rails and momentum. ultimatley needed mod A for trunk elevation and to control descent.    Transfers Overall transfer level: Needs assistance Equipment used: Rolling walker (2 wheeled) Transfers: Sit to/from Stand Sit to Stand: +2 physical assistance;Mod assist;+2 safety/equipment;From elevated surface         General transfer comment: from elevated EOB. Pt with impulsivity and decreased insight into deficits. Multiple attempts prior to successful 1x stand. multimodal cues not to try to walk away from EOB d/t unsteadiness, weakness and safety concern (needs +2 assist).    Balance Overall balance assessment: Needs assistance Sitting-balance support: Bilateral upper extremity supported;Feet supported Sitting balance-Leahy Scale: Poor Sitting balance - Comments: BUE support to maintain static sitting   Standing balance support: Bilateral upper extremity supported;During functional activity Standing balance-Leahy Scale: Poor Standing balance comment: max A and rw to achieve standing, able to progress to min A and rw to maintain static standing                           ADL either performed or assessed with clinical judgement   ADL Overall ADL's : Needs assistance/impaired Eating/Feeding: Set up;Sitting   Grooming: Minimal assistance;Sitting   Upper Body Bathing: Sitting;Minimal assistance   Lower Body Bathing: Maximal assistance;+2 for physical assistance;Sit to/from stand   Upper Body Dressing : Minimal assistance;Sitting   Lower Body Dressing: Maximal assistance;+2 for physical assistance;Sit to/from stand                 General ADL Comments: Needs supported  sitting for UB ADLs. Pt completed bed mobility, sat EOB several minutes, after several unsuccessful attempts pt able to stand 1x. Unable to progress further this session as pt will need +2 assist to safely attempt. Pt also  with 8/10 FACEs pain in L ankle in standing.     Vision         Perception     Praxis      Pertinent Vitals/Pain Pain Assessment: Faces Faces Pain Scale: Hurts whole lot Pain Location: L ankle in standing (weightbearing) position Pain Descriptors / Indicators: Grimacing;Moaning;Guarding Pain Intervention(s): Premedicated before session;Monitored during session;Limited activity within patient's tolerance;Repositioned     Hand Dominance     Extremity/Trunk Assessment Upper Extremity Assessment Upper Extremity Assessment: Generalized weakness   Lower Extremity Assessment Lower Extremity Assessment: Defer to PT evaluation       Communication Communication Communication: No difficulties   Cognition Arousal/Alertness: Awake/alert Behavior During Therapy: Impulsive;WFL for tasks assessed/performed Overall Cognitive Status: No family/caregiver present to determine baseline cognitive functioning                                 General Comments: A&O to person, place, and time. Decreased insight into current deficits (disoriented to situation?), decreased problem solving and safety awareness. Some difficulty sequencing.   General Comments  facial asymmetry and slurred speech noted    Exercises     Shoulder Instructions      Home Living Family/patient expects to be discharged to:: Private residence Living Arrangements: Alone   Type of Home: Apartment Home Access: Stairs to enter [employer] of Steps: 3 flight, 3rd floor apartment Entrance Stairs-Rails: None Home Layout: One level     Bathroom Shower/Tub: Tub/shower unit         Home Equipment: Cane - quad;Cane - single point   Additional Comments: no available assist at home. works as an Scientist, research (medical).      Prior Functioning/Environment Level of Independence: Independent        Comments: very seldom use use cane        OT Problem List: Decreased strength;Decreased activity  tolerance;Impaired balance (sitting and/or standing);Decreased cognition;Decreased safety awareness;Decreased knowledge of use of DME or AE;Decreased knowledge of precautions;Pain;Increased edema      OT Treatment/Interventions: Self-care/ADL training;Therapeutic exercise;DME and/or AE instruction;Therapeutic activities;Cognitive remediation/compensation;Patient/family education;Balance training    OT Goals(Current goals can be found in the care plan section) Acute Rehab OT Goals Patient Stated Goal: home OT Goal Formulation: With patient Time For Goal Achievement: 08/10/21 Potential to Achieve Goals: Good  OT Frequency: Min 2X/week   Barriers to D/C:            Co-evaluation              AM-PAC OT "6 Clicks" Daily Activity     Outcome Measure Help from another person eating meals?: A Little Help from another person taking care of personal grooming?: A Little Help from another person toileting, which includes using toliet, bedpan, or urinal?: Total Help from another person bathing (including washing, rinsing, drying)?: Total Help from another person to put on and taking off regular upper body clothing?: A Little Help from another person to put on and taking off regular lower body clothing?: Total 6 Click Score: 12   End of Session Equipment Utilized During Treatment: Rolling walker  Activity Tolerance: Patient limited by pain;Patient limited by fatigue Patient left: in bed;with call bell/phone within reach;with bed alarm  set  OT Visit Diagnosis: Unsteadiness on feet (R26.81);Muscle weakness (generalized) (M62.81);Other symptoms and signs involving cognitive function;Other symptoms and signs involving the nervous system (R29.898);Pain Pain - Right/Left: Left Pain - part of body: Ankle and joints of foot                Time: 1610-9604 OT Time Calculation (min): 28 min Charges:  OT General Charges $OT Visit: 1 Visit OT Evaluation $OT Eval Moderate Complexity: 1 Mod OT  Treatments $Self Care/Home Management : 8-22 mins  Raynald Kemp, OT Acute Rehabilitation Services Pager: [phone] Office: [phone]   Pilar Grammes 07/27/2021, 10:46 AM

## 2021-07-27 NOTE — Evaluation (Signed)
Physical Therapy Evaluation Patient Details Name: Wayne Wood MRN: [id number] DOB: 05/01/1956 Today's Date: 07/27/2021  History of Present Illness  Pt is a 65 yo male who presented to [facility] ED due to altered mental status and suspected seizure thought to be from hyperglycemia. + left ankle pain; xray negative for acute injury  PMH includes essential hypertension, hyperlipidemia, GERD, type 2 diabetes, obesity.  Clinical Impression   Pt admitted secondary to problem above with deficits below. PTA patient was living in a 3rd floor apartment (without an elevator) and was independent.  Pt currently requires assist to stand from an elevated surface (was not able to stand with 1 person assist from normal height). He was not able to walk due to severity of left ankle pain.  Anticipate patient will benefit from PT to address problems listed below.Will continue to follow acutely to maximize functional mobility independence and safety.          Recommendations for follow up therapy are one component of a multi-disciplinary discharge planning process, led by the attending physician.  Recommendations may be updated based on patient status, additional functional criteria and insurance authorization.  Follow Up Recommendations SNF (currently cannot walk due to ankle pain; if improves, may be able to climb 3 flights of steps to apartment)    Equipment Recommendations  Rolling walker with 5" wheels    Recommendations for Other Services       Precautions / Restrictions Precautions Precautions: Fall;Other (comment) (h/o recent seizures) Restrictions Weight Bearing Restrictions: No Other Position/Activity Restrictions: painful and swollen L ankle with limited AROM      Mobility  Bed Mobility Overal bed mobility: Needs Assistance Bed Mobility: Supine to Sit;Sit to Supine     Supine to sit: Min assist;HOB elevated Sit to supine: Supervision   General bed mobility comments: effortful, +  rails    Transfers Overall transfer level: Needs assistance Equipment used: Rolling walker (2 wheeled) Transfers: Sit to/from Stand Sit to Stand: From elevated surface;Min assist         General transfer comment: attemptd x 5 from bed at lowest height (pt wanting to attempt different hand positions despite cues for safest technique). from elevated EOB stood x 1 with incr time and effort.  Ambulation/Gait             General Gait Details: ankle too painful to attempt gait  Stairs            Wheelchair Mobility    Modified Rankin (Stroke Patients Only)       Balance Overall balance assessment: Needs assistance Sitting-balance support: Bilateral upper extremity supported;Feet supported Sitting balance-Leahy Scale: Fair Sitting balance - Comments: BUE support to maintain static sitting   Standing balance support: Bilateral upper extremity supported;During functional activity Standing balance-Leahy Scale: Poor Standing balance comment: min A and rw to achieve standing, able to progress to min A and rw to maintain static standing                             Pertinent Vitals/Pain Pain Assessment: Faces Faces Pain Scale: Hurts even more Pain Location: L ankle in standing (weightbearing) position Pain Descriptors / Indicators: Grimacing;Moaning;Guarding Pain Intervention(s): Limited activity within patient's tolerance;Monitored during session;Premedicated before session;Repositioned    Home Living Family/patient expects to be discharged to:: Private residence Living Arrangements: Alone   Type of Home: Apartment Home Access: Stairs to enter Entrance Stairs-Rails: None Entrance Stairs-Number of Steps: 3  flight, 3rd floor apartment Home Layout: One level Home Equipment: Cane - quad;Cane - single point Additional Comments: no available assist at home. works as an Scientist, research (medical).    Prior Function Level of Independence: Independent          Comments: very seldom use use cane     Hand Dominance        Extremity/Trunk Assessment   Upper Extremity Assessment Upper Extremity Assessment: Defer to OT evaluation    Lower Extremity Assessment Lower Extremity Assessment: LLE deficits/detail (+edema left ankle; lidocaine patch in place)    Cervical / Trunk Assessment Cervical / Trunk Assessment: Kyphotic  Communication   Communication: Other (comment) (slurred speech but states due to doesn't have his teeth in)  Cognition Arousal/Alertness: Awake/alert Behavior During Therapy: Impulsive Overall Cognitive Status: No family/caregiver present to determine baseline cognitive functioning                                 General Comments: A&O to person, place, situation, and time. decreased problem solving and safety awareness. Some difficulty sequencing (required repeated cues for safe use of RW).      General Comments General comments (skin integrity, edema, etc.): facial asymmetry and slurred speech noted    Exercises     Assessment/Plan    PT Assessment Patient needs continued PT services  PT Problem List Decreased strength;Decreased range of motion;Decreased activity tolerance;Decreased balance;Decreased mobility;Decreased cognition;Decreased knowledge of use of DME;Decreased safety awareness;Pain       PT Treatment Interventions DME instruction;Gait training;Stair training;Functional mobility training;Therapeutic activities;Therapeutic exercise;Balance training;Cognitive remediation;Patient/family education    PT Goals (Current goals can be found in the Care Plan section)  Acute Rehab PT Goals Patient Stated Goal: home PT Goal Formulation: With patient Time For Goal Achievement: 08/10/21 Potential to Achieve Goals: Good    Frequency Min 3X/week   Barriers to discharge Decreased caregiver support;Inaccessible home environment 3rd floor apartment (walk up); no assist available     Co-evaluation               AM-PAC PT "6 Clicks" Mobility  Outcome Measure Help needed turning from your back to your side while in a flat bed without using bedrails?: None Help needed moving from lying on your back to sitting on the side of a flat bed without using bedrails?: A Little Help needed moving to and from a bed to a chair (including a wheelchair)?: A Lot Help needed standing up from a chair using your arms (e.g., wheelchair or bedside chair)?: A Lot Help needed to walk in hospital room?: Total Help needed climbing 3-5 steps with a railing? : Total 6 Click Score: 13    End of Session Equipment Utilized During Treatment:  (pt refused use of gait belt) Activity Tolerance: Patient limited by pain Patient left: in bed;with call bell/phone within reach;with bed alarm set Nurse Communication: Mobility status;Other (comment) (remains limited by pain) PT Visit Diagnosis: Difficulty in walking, not elsewhere classified (R26.2);Pain Pain - Right/Left: Left Pain - part of body: Ankle and joints of foot    Time: 1321-1336 PT Time Calculation (min) (ACUTE ONLY): 15 min   Charges:   PT Evaluation $PT Eval Low Complexity: 1 Low           Jerolyn Center, PT Pager [phone]   Zena Amos 07/27/2021, 1:53 PM

## 2021-07-27 NOTE — Progress Notes (Addendum)
Inpatient Diabetes Program Recommendations  AACE/ADA: New Consensus Statement on Inpatient Glycemic Control (2015)  Target Ranges:  Prepandial:   less than 140 mg/dL      Peak postprandial:   less than 180 mg/dL (1-2 hours)      Critically ill patients:  140 - 180 mg/dL   Lab Results  Component Value Date   GLUCAP 228 (H) 07/27/2021   HGBA1C 11.7 (H) 07/25/2021    Review of Glycemic Control Results for BARNETT, ELZEY (MRN [id number]) as of 07/26/2021 13:26  Ref. Range 07/26/2021 05:55 07/26/2021 08:31 07/26/2021 12:25  Glucose-Capillary Latest Ref Range: 70 - 99 mg/dL 027 (H) 741 (H) 287 (H)   Diabetes history: Type 2 DM Outpatient Diabetes medications: Amaryl 4 mg QD, Metformin 1000 mg bid, Trulicity 0.75 mg qwk Current orders for Inpatient glycemic control: 70/30 14 units bid, Novolog 0-15 units TID & HS  A1c 11.7%  Inpatient Diabetes Program Recommendations:    Consider increasing Novolog 70/30 16 units BID.   Spoke with pt at bedside regarding A1c and glucose control at home. Pt reports not able to get his Trulicity due to the cost and being in the donut hole. Pt confirms he goes to the [place] ad has [employer] yet he is unable to et his medications.  Spoke with pt about the possible need for insulin and that the easiest would be [employer] insulin via the pen for $43 a box of 5 pens. I discussed with pt the need to take the insulin twice a day before breakfast and before supper. Pt report needing a new Accucheck meter but has supplies to it.  At time of d/c: -  Novolin ReliOn mix flexpen order # [id number] -  insulin pen needles order # [id number] -  pt prefers Accu check glucometer  Thanks, Christena Deem RN, MSN, BC-ADM Inpatient Diabetes Coordinator Team Pager [phone] (8a-5p)

## 2021-07-28 ENCOUNTER — Inpatient Hospital Stay (HOSPITAL_COMMUNITY): Payer: Medicare HMO

## 2021-07-28 DIAGNOSIS — R4182 Altered mental status, unspecified: Secondary | ICD-10-CM | POA: Diagnosis not present

## 2021-07-28 DIAGNOSIS — R569 Unspecified convulsions: Secondary | ICD-10-CM | POA: Diagnosis not present

## 2021-07-28 DIAGNOSIS — M25572 Pain in left ankle and joints of left foot: Secondary | ICD-10-CM | POA: Diagnosis not present

## 2021-07-28 DIAGNOSIS — E1165 Type 2 diabetes mellitus with hyperglycemia: Secondary | ICD-10-CM | POA: Diagnosis not present

## 2021-07-28 LAB — BASIC METABOLIC PANEL
Anion gap: 9 (ref 5–15)
BUN: 11 mg/dL (ref 8–23)
CO2: 23 mmol/L (ref 22–32)
Calcium: 8.8 mg/dL — ABNORMAL LOW (ref 8.9–10.3)
Chloride: 100 mmol/L (ref 98–111)
Creatinine, Ser: 1.41 mg/dL — ABNORMAL HIGH (ref 0.61–1.24)
GFR, Estimated: 55 mL/min — ABNORMAL LOW (ref >60)
Glucose, Bld: 202 mg/dL — ABNORMAL HIGH (ref 70–99)
Potassium: 3.6 mmol/L (ref 3.5–5.1)
Sodium: 132 mmol/L — ABNORMAL LOW (ref 135–145)

## 2021-07-28 LAB — GLUCOSE, CAPILLARY
Glucose-Capillary: 245 mg/dL — ABNORMAL HIGH (ref 70–99)
Glucose-Capillary: 317 mg/dL — ABNORMAL HIGH (ref 70–99)
Glucose-Capillary: 272 mg/dL — ABNORMAL HIGH (ref 70–99)
Glucose-Capillary: 254 mg/dL — ABNORMAL HIGH (ref 70–99)
Glucose-Capillary: 231 mg/dL — ABNORMAL HIGH (ref 70–99)

## 2021-07-28 MED ORDER — COLCHICINE 0.6 MG PO TABS
0.6000 mg | ORAL_TABLET | Freq: Once | ORAL | Status: AC
  Administered 2021-07-28: 0.6 mg via ORAL
  Filled 2021-07-28: qty 1

## 2021-07-28 MED ORDER — COLCHICINE 0.6 MG PO TABS
1.2000 mg | ORAL_TABLET | Freq: Once | ORAL | Status: AC
  Administered 2021-07-28: 1.2 mg via ORAL
  Filled 2021-07-28: qty 2

## 2021-07-28 MED ORDER — COLCHICINE 0.6 MG PO TABS
0.6000 mg | ORAL_TABLET | Freq: Every day | ORAL | Status: DC
  Administered 2021-07-29 – 2021-08-02 (×5): 0.6 mg via ORAL
  Filled 2021-07-28 (×7): qty 1

## 2021-07-28 MED ORDER — ACETAMINOPHEN 325 MG PO TABS
650.0000 mg | ORAL_TABLET | ORAL | Status: DC | PRN
  Administered 2021-07-28 (×2): 650 mg via ORAL
  Filled 2021-07-28 (×3): qty 2

## 2021-07-28 MED ORDER — INSULIN ASPART PROT & ASPART (70-30 MIX) 100 UNIT/ML ~~LOC~~ SUSP
24.0000 [IU] | Freq: Two times a day (BID) | SUBCUTANEOUS | Status: DC
  Administered 2021-07-28: 24 [IU] via SUBCUTANEOUS

## 2021-07-28 NOTE — Progress Notes (Signed)
Inpatient Diabetes Program Recommendations  AACE/ADA: New Consensus Statement on Inpatient Glycemic Control (2015)  Target Ranges:  Prepandial:   less than 140 mg/dL      Peak postprandial:   less than 180 mg/dL (1-2 hours)      Critically ill patients:  140 - 180 mg/dL   Lab Results  Component Value Date   GLUCAP 272 (H) 07/28/2021   HGBA1C 11.7 (H) 07/25/2021    Review of Glycemic Control Results for Wayne Wood, Wayne Wood (MRN [id number]) as of 07/28/2021 13:43  Ref. Range 07/27/2021 07:21 07/27/2021 11:32 07/27/2021 15:31 07/27/2021 20:56 07/28/2021 07:32 07/28/2021 11:54 07/28/2021 13:07  Glucose-Capillary Latest Ref Range: 70 - 99 mg/dL 626 (H) 948 (H) 546 (H) 219 (H) 245 (H) 317 (H) 272 (H)    Diabetes history: Type 2 DM Outpatient Diabetes medications: Amaryl 4 mg QD, Metformin 1000 mg bid, Trulicity 0.75 mg qwk Current orders for Inpatient glycemic control: 70/30 16 units bid, Novolog 0-15 units TID & HS  A1c 11.7%  Inpatient Diabetes Program Recommendations:    Consider increasing Novolog 70/30 20 units BID.   Spoke with pt at bedside on 9/27  At time of d/c: -  Novolin ReliOn mix flexpen order # [id number] -  insulin pen needles order # [id number] -  pt prefers Accu check glucometer  Thanks, Christena Deem RN, MSN, BC-ADM Inpatient Diabetes Coordinator Team Pager [phone] (8a-5p)

## 2021-07-28 NOTE — Progress Notes (Signed)
PROGRESS NOTE        PATIENT DETAILS Name: Wayne Wood Age: 65 y.o. Sex: male Date of Birth: 06-01-1956 Admit Date: 07/24/2021 Admitting Physician Joseph Art, DO OZH:YQMVH, Marlynn Perking., FNP  Brief Narrative: Patient is a 65 y.o. male with history of HTN, HLD, GERD, DM-2 presented with AMS and subsequent seizure-thought to have provoked seizures due to uncontrolled hyperglycemia.  Subjective: Lying comfortably in bed-denies any chest pain or shortness of breath.  Complains of ongoing severe left ankle pain.  Claims he is unable to bear weight.  Objective: Vitals: Blood pressure 129/61, pulse 88, temperature 98 F (36.7 C), temperature source Oral, resp. rate 17, height 5\' 10"  (1.778 m), weight 103.4 kg, SpO2 93 %.   Exam: Gen Exam:Alert awake-not in any distress HEENT:atraumatic, normocephalic Chest: B/L clear to auscultation anteriorly CVS:S1S2 regular Abdomen:soft non tender, non distended Extremities:no edema Neurology: Non focal Skin: no rash  Pertinent Labs/Radiology: Na:132 K: 3.6 Creatinine: 1.41  9/25>> MRI brain: No acute intracranial abnormality.>>CXR:   Assessment/Plan: Seizure: Provoked by hyperglycemia-neurology recommends starting AED/EEG if seizure reoccurs.  Acute metabolic encephalopathy: Due to combination of hypoglycemia/postictal state-resolved-patient is completely awake and alert.  AKI: Likely hemodynamically mediated-improving  Uncontrolled DM-2 with hyperglycemia (A1c 11.7 on 9/25): CBGs improving-increase insulin 70/30 to 24 units twice daily.  Reassess on 9/29  Recent Labs    07/28/21 0732 07/28/21 1154 07/28/21 1307  GLUCAP 245* 317* 272*    HLD: Continue statin  Left ankle pain: Claims he may have fell at home-x-ray negative for fractures-although he has a history of gout-given history of fall-we will obtain a CT left ankle to ensure no fracture.  Given colchicine last night-no response  yet-reassess on 9/29.  GERD: Continue PPI  BPH: Continue Flomax  Obesity: Estimated body mass index is 32.71 kg/m as calculated from the following:   Height as of this encounter: 5\' 10"  (1.778 m).   Weight as of this encounter: 103.4 kg.    Procedures: None Consults: None DVT Prophylaxis: Lovenox Code Status:Full code  Family Communication: None at bedside  Time spent: 35 minutes-Greater than 50% of this time was spent in counseling, explanation of diagnosis, planning of further management, and coordination of care.  Diet: Diet Order             DIET SOFT Room service appropriate? Yes; Fluid consistency: Thin  Diet effective now                      Disposition Plan: Status is: Inpatient  Remains inpatient appropriate because:Inpatient level of care appropriate due to severity of illness  Dispo:  Patient From: Home  Planned Disposition: Home vs SNF  Medically stable for discharge: No      Barriers to Discharge: Optimize glycemic regimen-obtain CT left ankle to rule out occult fracture-May need SNF  Antimicrobial agents: Anti-infectives (From admission, onward)    None        MEDICATIONS: Scheduled Meds:  enoxaparin (LOVENOX) injection  40 mg Subcutaneous Daily   folic acid  1 mg Oral Daily   insulin aspart  0-15 Units Subcutaneous TID WC   insulin aspart  0-5 Units Subcutaneous QHS   insulin aspart protamine- aspart  16 Units Subcutaneous BID WC   latanoprost  1 drop Both Eyes q morning   lidocaine  1 patch  Transdermal Q24H   multivitamin with minerals  1 tablet Oral Daily   pantoprazole  40 mg Oral Daily   simvastatin  40 mg Oral QHS   tamsulosin  0.4 mg Oral Daily   thiamine  100 mg Oral Daily   Continuous Infusions: PRN Meds:.acetaminophen, LORazepam   I have personally reviewed following labs and imaging studies  LABORATORY DATA: CBC: Recent Labs  Lab 07/24/21 2226 07/25/21 0017 07/25/21 0132 07/25/21 0204 07/25/21 0549   WBC 10.7*  --   --  11.7* 11.7*  NEUTROABS 7.2  --   --   --   --   HGB 13.0 14.6  14.3 12.2* 12.5* 12.5*  HCT 38.8* 43.0  42.0 36.0* 37.5* 37.6*  MCV 91.5  --   --  89.5 90.0  PLT 229  --   --  226 227    Basic Metabolic Panel: Recent Labs  Lab 07/24/21 2226 07/25/21 0017 07/25/21 0132 07/25/21 0204 07/25/21 0420 07/26/21 1346 07/27/21 0105 07/28/21 0028  NA 132* 136  135 134*  --  134* 136 137 132*  K 4.1 3.3*  3.3* 4.0  --  4.1 3.8 3.4* 3.6  CL 98 100  --   --  101 100 101 100  CO2 16*  --   --   --  22 26 27 23   GLUCOSE 561* 577*  --   --  507* 347* 168* 202*  BUN 13 16  --   --  13 9 7* 11  CREATININE 1.97* 1.70*  --  1.66* 1.62* 1.48* 1.40* 1.41*  CALCIUM 9.4  --   --   --  9.1 9.2 9.2 8.8*  MG 2.0  --   --   --   --   --   --   --     GFR: Estimated Creatinine Clearance: 62.9 mL/min (A) (by C-G formula based on SCr of 1.41 mg/dL (H)).  Liver Function Tests: Recent Labs  Lab 07/24/21 2226  AST 27  ALT 22  ALKPHOS 100  BILITOT 0.6  PROT 7.1  ALBUMIN 3.4*   No results for input(s): LIPASE, AMYLASE in the last 168 hours. Recent Labs  Lab 07/24/21 2310  AMMONIA 60*    Coagulation Profile: Recent Labs  Lab 07/24/21 2226  INR 1.0    Cardiac Enzymes: No results for input(s): CKTOTAL, CKMB, CKMBINDEX, TROPONINI in the last 168 hours.  BNP (last 3 results) No results for input(s): PROBNP in the last 8760 hours.  Lipid Profile: No results for input(s): CHOL, HDL, LDLCALC, TRIG, CHOLHDL, LDLDIRECT in the last 72 hours.  Thyroid Function Tests: No results for input(s): TSH, T4TOTAL, FREET4, T3FREE, THYROIDAB in the last 72 hours.  Anemia Panel: No results for input(s): VITAMINB12, FOLATE, FERRITIN, TIBC, IRON, RETICCTPCT in the last 72 hours.  Urine analysis:    Component Value Date/Time   COLORURINE STRAW (A) 07/25/2021 0026   APPEARANCEUR CLEAR 07/25/2021 0026   LABSPEC 1.021 07/25/2021 0026   PHURINE 5.0 07/25/2021 0026   GLUCOSEU  >=500 (A) 07/25/2021 0026   HGBUR MODERATE (A) 07/25/2021 0026   BILIRUBINUR NEGATIVE 07/25/2021 0026   KETONESUR 5 (A) 07/25/2021 0026   PROTEINUR NEGATIVE 07/25/2021 0026   UROBILINOGEN 1.0 06/22/2011 1950   NITRITE NEGATIVE 07/25/2021 0026   LEUKOCYTESUR NEGATIVE 07/25/2021 0026    Sepsis Labs: Lactic Acid, Venous    Component Value Date/Time   LATICACIDVEN 0.7 06/23/2011 0105    MICROBIOLOGY: Recent Results (from the past 240 hour(s))  Resp Panel by RT-PCR (Flu A&B, Covid) Nasopharyngeal Swab     Status: None   Collection Time: 07/24/21 10:26 PM   Specimen: Nasopharyngeal Swab; Nasopharyngeal(NP) swabs in vial transport medium  Result Value Ref Range Status   SARS Coronavirus 2 by RT PCR NEGATIVE NEGATIVE Final    Comment: (NOTE) SARS-CoV-2 target nucleic acids are NOT DETECTED.  The SARS-CoV-2 RNA is generally detectable in upper respiratory specimens during the acute phase of infection. The lowest concentration of SARS-CoV-2 viral copies this assay can detect is 138 copies/mL. A negative result does not preclude SARS-Cov-2 infection and should not be used as the sole basis for treatment or other patient management decisions. A negative result may occur with  improper specimen collection/handling, submission of specimen other than nasopharyngeal swab, presence of viral mutation(s) within the areas targeted by this assay, and inadequate number of viral copies(<138 copies/mL). A negative result must be combined with clinical observations, patient history, and epidemiological information. The expected result is Negative.  Fact Sheet for Patients:  BloggerCourse.com  Fact Sheet for Healthcare Providers:  SeriousBroker.it  This test is no t yet approved or cleared by the [place] FDA and  has been authorized for detection and/or diagnosis of SARS-CoV-2 by FDA under an Emergency Use Authorization (EUA). This EUA will  remain  in effect (meaning this test can be used) for the duration of the COVID-19 declaration under Section 564(b)(1) of the Act, 21 U.S.C.section 360bbb-3(b)(1), unless the authorization is terminated  or revoked sooner.       Influenza A by PCR NEGATIVE NEGATIVE Final   Influenza B by PCR NEGATIVE NEGATIVE Final    Comment: (NOTE) The Xpert Xpress SARS-CoV-2/FLU/RSV plus assay is intended as an aid in the diagnosis of influenza from Nasopharyngeal swab specimens and should not be used as a sole basis for treatment. Nasal washings and aspirates are unacceptable for Xpert Xpress SARS-CoV-2/FLU/RSV testing.  Fact Sheet for Patients: BloggerCourse.com  Fact Sheet for Healthcare Providers: SeriousBroker.it  This test is not yet approved or cleared by the [place] FDA and has been authorized for detection and/or diagnosis of SARS-CoV-2 by FDA under an Emergency Use Authorization (EUA). This EUA will remain in effect (meaning this test can be used) for the duration of the COVID-19 declaration under Section 564(b)(1) of the Act, 21 U.S.C. section 360bbb-3(b)(1), unless the authorization is terminated or revoked.  Performed at [facility], 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 No acute fracture or dislocation. IMPRESSION: Mild left ankle and hindfoot osteoarthrosis. Electronically Signed   By: Deatra Robinson M.D.   On: 07/27/2021 00:15   DG CHEST PORT 1 VIEW  Result Date: 07/27/2021 CLINICAL DATA:  Fever EXAM: PORTABLE CHEST 1 VIEW COMPARISON:  07/24/2021 FINDINGS: Prior median sternotomy. Heart and mediastinal contours are within normal limits. No focal opacities or effusions. No acute bony abnormality.  IMPRESSION: No active disease. Electronically Signed   By: Charlett Nose M.D.   On: 07/27/2021 09:07     LOS: 3 days   Jeoffrey Massed, MD  [facility]    To contact the attending provider between 7A-7P or the covering provider during after hours 7P-7A, please log into the web site [email or web address] and access using universal [place] password for that web site.  If you do not have the password, please call the hospital operator.  07/28/2021, 1:53 PM

## 2021-07-28 NOTE — Progress Notes (Signed)
HOSPITAL MEDICINE OVERNIGHT EVENT NOTE    Patient continuing to complain of pain of the right shoulder and left ankle, similar to his complaints yesterday.  Of note, uric acid is unremarkable.  Ankle x-ray reveals osteoarthritis.  Daytime provider felt that it was unlikely the patient's symptoms were related to acute gout and therefore was managing patient's ankle pain with a lidocaine patch and as needed oral analgesics  Patient is insisting to nursing that he thinks this is his gout and is requesting his home regimen of colchicine.  Considering the risk of adverse effects by administering colchicine is quite low, we will go ahead and provide patient with the usual regimen for acute gout which is 1.2 mg of colchicine now followed by 0.6 mg in 1 hour and reassess for response.  Marinda Elk  MD [facility]

## 2021-07-28 NOTE — Progress Notes (Signed)
Physical Therapy Treatment Patient Details Name: Wayne Wood MRN: [id number] DOB: 01/25/1956 Today's Date: 07/28/2021   History of Present Illness Pt is a 65 yo male who presented to [facility] ED due to altered mental status and suspected seizure thought to be from hyperglycemia. + left ankle pain; xray negative for acute injury  PMH includes essential hypertension, hyperlipidemia, GERD, type 2 diabetes, obesity.    PT Comments    Pt remains limited by ankle pain this session. Pt does not follow commands for transfer technique consistently and declines PT physical assistance during session. Pt remains at a high risk for falls due to pain mediated weakness and reduced activity tolerance. PT continues to recommend SNF placement at this time.   Recommendations for follow up therapy are one component of a multi-disciplinary discharge planning process, led by the attending physician.  Recommendations may be updated based on patient status, additional functional criteria and insurance authorization.  Follow Up Recommendations  SNF (may improve if pain is better managed)     Equipment Recommendations  Rolling walker with 5" wheels    Recommendations for Other Services       Precautions / Restrictions Precautions Precautions: Fall;Other (comment) (history of recent seizures) Restrictions Weight Bearing Restrictions: No Other Position/Activity Restrictions: bilateral ankle pain, L more significant than R     Mobility  Bed Mobility Overal bed mobility: Needs Assistance Bed Mobility: Supine to Sit;Sit to Supine     Supine to sit: Supervision;HOB elevated Sit to supine: Supervision;HOB elevated   General bed mobility comments: increased time, use of rails    Transfers Overall transfer level: Needs assistance Equipment used: Rolling walker (2 wheeled) Transfers: Sit to/from Stand Sit to Stand: Min assist;From elevated surface         General transfer comment: significantly  elevated surface. PT provides cues for foot placement, hand placement, trunk flexion. Pt not consistently following commands or accepting PT assistance.  Ambulation/Gait Ambulation/Gait assistance:  (pt unable to clear either foot to step in standing)               Stairs             Wheelchair Mobility    Modified Rankin (Stroke Patients Only)       Balance Overall balance assessment: Needs assistance Sitting-balance support: No upper extremity supported;Feet supported Sitting balance-Leahy Scale: Good     Standing balance support: Bilateral upper extremity supported Standing balance-Leahy Scale: Poor Standing balance comment: reliant on UE support of walker                            Cognition Arousal/Alertness: Awake/alert Behavior During Therapy: Impulsive Overall Cognitive Status: Impaired/Different from baseline Area of Impairment: Following commands                       Following Commands: Follows one step commands inconsistently (pt choosing to not follow PT cues for mobility consistently)              Exercises      General Comments General comments (skin integrity, edema, etc.): VSS on RA, mild tachycardia to 104 BPM in standing      Pertinent Vitals/Pain Pain Assessment: Faces Faces Pain Scale: Hurts whole lot Pain Location: L ankle Pain Descriptors / Indicators: Grimacing Pain Intervention(s): Monitored during session    Home Living  Prior Function            PT Goals (current goals can now be found in the care plan section) Acute Rehab PT Goals Patient Stated Goal: home Progress towards PT goals: Not progressing toward goals - comment (limited by ankle pain)    Frequency    Min 3X/week      PT Plan Current plan remains appropriate    Co-evaluation              AM-PAC PT "6 Clicks" Mobility   Outcome Measure  Help needed turning from your back to your side  while in a flat bed without using bedrails?: None Help needed moving from lying on your back to sitting on the side of a flat bed without using bedrails?: A Little Help needed moving to and from a bed to a chair (including a wheelchair)?: A Lot Help needed standing up from a chair using your arms (e.g., wheelchair or bedside chair)?: A Little Help needed to walk in hospital room?: Total Help needed climbing 3-5 steps with a railing? : Total 6 Click Score: 14    End of Session   Activity Tolerance: Patient limited by pain Patient left: in bed;with call bell/phone within reach;with bed alarm set Nurse Communication: Mobility status PT Visit Diagnosis: Difficulty in walking, not elsewhere classified (R26.2);Pain Pain - Right/Left: Left Pain - part of body: Ankle and joints of foot     Time: 2992-4268 PT Time Calculation (min) (ACUTE ONLY): 23 min  Charges:  $Therapeutic Activity: 23-37 mins                     Arlyss Gandy, PT, DPT Acute Rehabilitation Pager: [phone]    Arlyss Gandy 07/28/2021, 12:40 PM

## 2021-07-28 NOTE — Care Management Important Message (Signed)
Important Message  Patient Details  Name: Wayne Wood MRN: [id number] Date of Birth: 1956/03/24   Medicare Important Message Given:  Yes     Dorena Bodo 07/28/2021, 4:30 PM

## 2021-07-29 DIAGNOSIS — M25572 Pain in left ankle and joints of left foot: Secondary | ICD-10-CM | POA: Diagnosis not present

## 2021-07-29 DIAGNOSIS — E1165 Type 2 diabetes mellitus with hyperglycemia: Secondary | ICD-10-CM | POA: Diagnosis not present

## 2021-07-29 DIAGNOSIS — R4182 Altered mental status, unspecified: Secondary | ICD-10-CM | POA: Diagnosis not present

## 2021-07-29 DIAGNOSIS — I10 Essential (primary) hypertension: Secondary | ICD-10-CM | POA: Diagnosis not present

## 2021-07-29 LAB — GLUCOSE, CAPILLARY
Glucose-Capillary: 258 mg/dL — ABNORMAL HIGH (ref 70–99)
Glucose-Capillary: 451 mg/dL — ABNORMAL HIGH (ref 70–99)
Glucose-Capillary: 454 mg/dL — ABNORMAL HIGH (ref 70–99)
Glucose-Capillary: 336 mg/dL — ABNORMAL HIGH (ref 70–99)

## 2021-07-29 MED ORDER — INSULIN ASPART PROT & ASPART (70-30 MIX) 100 UNIT/ML ~~LOC~~ SUSP
30.0000 [IU] | Freq: Two times a day (BID) | SUBCUTANEOUS | Status: DC
  Administered 2021-07-29 – 2021-07-30 (×2): 30 [IU] via SUBCUTANEOUS
  Filled 2021-07-29 (×2): qty 10

## 2021-07-29 MED ORDER — METHYLPREDNISOLONE SODIUM SUCC 125 MG IJ SOLR
60.0000 mg | Freq: Once | INTRAMUSCULAR | Status: AC
  Administered 2021-07-29: 60 mg via INTRAVENOUS
  Filled 2021-07-29: qty 2

## 2021-07-29 MED ORDER — PREDNISONE 20 MG PO TABS: 40.0000 mg | ORAL_TABLET | Freq: Every day | ORAL | Status: DC

## 2021-07-29 NOTE — Progress Notes (Signed)
PROGRESS NOTE        PATIENT DETAILS Name: Wayne Wood Age: 65 y.o. Sex: male Date of Birth: 01-01-1956 Admit Date: 07/24/2021 Admitting Physician Joseph Art, DO SAY:TKZSW, Marlynn Perking., FNP  Brief Narrative: Patient is a 65 y.o. male with history of HTN, HLD, GERD, DM-2 presented with AMS and subsequent seizure-thought to have provoked seizures due to uncontrolled hyperglycemia.  Subjective: Continues to have pain in the left ankle-unable to bear weight properly.  Objective: Vitals: Blood pressure (!) 141/78, pulse 90, temperature 98.2 F (36.8 C), temperature source Oral, resp. rate 20, height 5\' 10"  (1.778 m), weight 103.4 kg, SpO2 100 %.   Exam: Gen Exam:Alert awake-not in any distress HEENT:atraumatic, normocephalic Chest: B/L clear to auscultation anteriorly CVS:S1S2 regular Abdomen:soft non tender, non distended Extremities:no edema Neurology: Non focal Skin: no rash   Pertinent Labs/Radiology:   9/25>> MRI brain: No acute intracranial abnormality. 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   Assessment/Plan: Seizure: Provoked by hyperglycemia-neurology recommends starting AED/EEG if seizure reoccurs.  Acute metabolic encephalopathy: Due to combination of hypoglycemia/postictal state-resolved-patient is completely awake and alert.  AKI: Likely hemodynamically mediated-improving  Uncontrolled DM-2 with hyperglycemia (A1c 11.7 on 9/25): CBGs remains on the higher side-being started on steroids for arthritis-increase insulin 70/30 to 30 units twice daily-reassess on 9/30   Recent Labs    07/28/21 1722 07/28/21 2045 07/29/21 0728  GLUCAP 254* 231* 258*     HLD: Continue statin  Left ankle pain: Claims to have fallen at home-ankle x-ray/CT scan negative for fractures-apparently has a history of gout-no response to colchicine-we  will try steroids.  Reassess on 9/30.  If no improvement-May need orthopedic consultation for intra-articular steroid injection.  GERD: Continue PPI  BPH: Continue Flomax  Obesity: Estimated body mass index is 32.71 kg/m as calculated from the following:   Height as of this encounter: 5\' 10"  (1.778 m).   Weight as of this encounter: 103.4 kg.    Procedures: None Consults: None DVT Prophylaxis: Lovenox Code Status:Full code  Family Communication: None at bedside  Time spent: 25 minutes-Greater than 50% of this time was spent in counseling, explanation of diagnosis, planning of further management, and coordination of care.  Diet: Diet Order             DIET SOFT Room service appropriate? Yes; Fluid consistency: Thin  Diet effective now                      Disposition Plan: Status is: Inpatient  Remains inpatient appropriate because:Inpatient level of care appropriate due to severity of illness  Dispo:  Patient From: Home  Planned Disposition: Home vs SNF  Medically stable for discharge: No      Barriers to Discharge: Optimize glycemic regimen-obtain CT left ankle to rule out occult fracture-May need SNF  Antimicrobial agents: Anti-infectives (From admission, onward)    None        MEDICATIONS: Scheduled Meds:  colchicine  0.6 mg Oral Daily   enoxaparin (LOVENOX) injection  40 mg Subcutaneous Daily   folic acid  1 mg Oral Daily   insulin aspart  0-15 Units Subcutaneous TID WC   insulin aspart  0-5 Units Subcutaneous QHS   insulin aspart protamine- aspart  30 Units Subcutaneous  BID WC   latanoprost  1 drop Both Eyes q morning   lidocaine  1 patch Transdermal Q24H   multivitamin with minerals  1 tablet Oral Daily   pantoprazole  40 mg Oral Daily   [START ON 07/30/2021] predniSONE  40 mg Oral Q breakfast   simvastatin  40 mg Oral QHS   tamsulosin  0.4 mg Oral Daily   thiamine  100 mg Oral Daily   Continuous Infusions: PRN Meds:.acetaminophen,  LORazepam   I have personally reviewed following labs and imaging studies  LABORATORY DATA: CBC: Recent Labs  Lab 07/24/21 2226 07/25/21 0017 07/25/21 0132 07/25/21 0204 07/25/21 0549  WBC 10.7*  --   --  11.7* 11.7*  NEUTROABS 7.2  --   --   --   --   HGB 13.0 14.6  14.3 12.2* 12.5* 12.5*  HCT 38.8* 43.0  42.0 36.0* 37.5* 37.6*  MCV 91.5  --   --  89.5 90.0  PLT 229  --   --  226 227     Basic Metabolic Panel: Recent Labs  Lab 07/24/21 2226 07/25/21 0017 07/25/21 0132 07/25/21 0204 07/25/21 0420 07/26/21 1346 07/27/21 0105 07/28/21 0028  NA 132* 136  135 134*  --  134* 136 137 132*  K 4.1 3.3*  3.3* 4.0  --  4.1 3.8 3.4* 3.6  CL 98 100  --   --  101 100 101 100  CO2 16*  --   --   --  22 26 27 23   GLUCOSE 561* 577*  --   --  507* 347* 168* 202*  BUN 13 16  --   --  13 9 7* 11  CREATININE 1.97* 1.70*  --  1.66* 1.62* 1.48* 1.40* 1.41*  CALCIUM 9.4  --   --   --  9.1 9.2 9.2 8.8*  MG 2.0  --   --   --   --   --   --   --      GFR: Estimated Creatinine Clearance: 62.9 mL/min (A) (by C-G formula based on SCr of 1.41 mg/dL (H)).  Liver Function Tests: Recent Labs  Lab 07/24/21 2226  AST 27  ALT 22  ALKPHOS 100  BILITOT 0.6  PROT 7.1  ALBUMIN 3.4*    No results for input(s): LIPASE, AMYLASE in the last 168 hours. Recent Labs  Lab 07/24/21 2310  AMMONIA 60*     Coagulation Profile: Recent Labs  Lab 07/24/21 2226  INR 1.0     Cardiac Enzymes: No results for input(s): CKTOTAL, CKMB, CKMBINDEX, TROPONINI in the last 168 hours.  BNP (last 3 results) No results for input(s): PROBNP in the last 8760 hours.  Lipid Profile: No results for input(s): CHOL, HDL, LDLCALC, TRIG, CHOLHDL, LDLDIRECT in the last 72 hours.  Thyroid Function Tests: No results for input(s): TSH, T4TOTAL, FREET4, T3FREE, THYROIDAB in the last 72 hours.  Anemia Panel: No results for input(s): VITAMINB12, FOLATE, FERRITIN, TIBC, IRON, RETICCTPCT in the last 72  hours.  Urine analysis:    Component Value Date/Time   COLORURINE STRAW (A) 07/25/2021 0026   APPEARANCEUR CLEAR 07/25/2021 0026   LABSPEC 1.021 07/25/2021 0026   PHURINE 5.0 07/25/2021 0026   GLUCOSEU >=500 (A) 07/25/2021 0026   HGBUR MODERATE (A) 07/25/2021 0026   BILIRUBINUR NEGATIVE 07/25/2021 0026   KETONESUR 5 (A) 07/25/2021 0026   PROTEINUR NEGATIVE 07/25/2021 0026   UROBILINOGEN 1.0 06/22/2011 1950   NITRITE NEGATIVE 07/25/2021 0026   LEUKOCYTESUR  NEGATIVE 07/25/2021 0026    Sepsis Labs: Lactic Acid, Venous    Component Value Date/Time   LATICACIDVEN 0.7 06/23/2011 0105    MICROBIOLOGY: Recent Results (from the past 240 hour(s))  Resp Panel by RT-PCR (Flu A&B, Covid) Nasopharyngeal Swab     Status: None   Collection Time: 07/24/21 10:26 PM   Specimen: Nasopharyngeal Swab; Nasopharyngeal(NP) swabs in vial transport medium  Result Value Ref Range Status   SARS Coronavirus 2 by RT PCR NEGATIVE NEGATIVE Final    Comment: (NOTE) SARS-CoV-2 target nucleic acids are NOT DETECTED.  The SARS-CoV-2 RNA is generally detectable in upper respiratory specimens during the acute phase of infection. The lowest concentration of SARS-CoV-2 viral copies this assay can detect is 138 copies/mL. A negative result does not preclude SARS-Cov-2 infection and should not be used as the sole basis for treatment or other patient management decisions. A negative result may occur with  improper specimen collection/handling, submission of specimen other than nasopharyngeal swab, presence of viral mutation(s) within the areas targeted by this assay, and inadequate number of viral copies(<138 copies/mL). A negative result must be combined with clinical observations, patient history, and epidemiological information. The expected result is Negative.  Fact Sheet for Patients:  BloggerCourse.com  Fact Sheet for Healthcare Providers:   SeriousBroker.it  This test is no t yet approved or cleared by the [place] FDA and  has been authorized for detection and/or diagnosis of SARS-CoV-2 by FDA under an Emergency Use Authorization (EUA). This EUA will remain  in effect (meaning this test can be used) for the duration of the COVID-19 declaration under Section 564(b)(1) of the Act, 21 U.S.C.section 360bbb-3(b)(1), unless the authorization is terminated  or revoked sooner.       Influenza A by PCR NEGATIVE NEGATIVE Final   Influenza B by PCR NEGATIVE NEGATIVE Final    Comment: (NOTE) The Xpert Xpress SARS-CoV-2/FLU/RSV plus assay is intended as an aid in the diagnosis of influenza from Nasopharyngeal swab specimens and should not be used as a sole basis for treatment. Nasal washings and aspirates are unacceptable for Xpert Xpress SARS-CoV-2/FLU/RSV testing.  Fact Sheet for Patients: BloggerCourse.com  Fact Sheet for Healthcare Providers: SeriousBroker.it  This test is not yet approved or cleared by the [place] FDA and has been authorized for detection and/or diagnosis of SARS-CoV-2 by FDA under an Emergency Use Authorization (EUA). This EUA will remain in effect (meaning this test can be used) for the duration of the COVID-19 declaration under Section 564(b)(1) of the Act, 21 U.S.C. section 360bbb-3(b)(1), unless the authorization is terminated or revoked.  Performed at [facility], [address], [place], [place] [address]     RADIOLOGY STUDIES/RESULTS: CT ANKLE LEFT WO CONTRAST  Result Date: 07/28/2021 CLINICAL DATA:  Severe ankle pain status post fall. EXAM: CT OF THE LEFT ANKLE WITHOUT CONTRAST TECHNIQUE: Multidetector CT imaging of the left ankle was performed according to the standard protocol. Multiplanar CT image reconstructions were also generated. COMPARISON:  None. FINDINGS: Bones/Joint/Cartilage No fracture  or dislocation. Normal alignment. No joint effusion. Mild osteoarthritis of the tibiotalar joint. Osteochondral lesions involving the medial and lateral corner of the talar dome. 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 Mild osteoarthritis of the subtalar joints. Moderate osteoarthritis of the talonavicular joint. Small plantar calcaneal spur. Ligaments Ligaments are suboptimally evaluated by CT. Muscles and Tendons Muscles are normal. No muscle atrophy. No intramuscular fluid collection or hematoma. Flexor, extensor, peroneal and Achilles tendons are intact. Soft  tissue No fluid collection or hematoma. No soft tissue mass. Small tissue wounds involving the medial aspect of the ankle with edema in the subcutaneous fat concerning for cellulitis. IMPRESSION: 1.  No acute osseous injury of the left ankle. 2. Mild osteoarthritis of the tibiotalar joint. 3. Osteochondral lesions involving the medial and lateral corner of the talar dome. 4. Mild osteoarthritis of the subtalar joints. Moderate osteoarthritis of the talonavicular joint. 5. Small tissue wounds involving the medial aspect of the ankle with edema in the subcutaneous fat concerning for cellulitis. Electronically Signed   By: Elige Ko M.D.   On: 07/28/2021 17:54     LOS: 4 days   Jeoffrey Massed, MD  [facility]    To contact the attending provider between 7A-7P or the covering provider during after hours 7P-7A, please log into the web site [email or web address] and access using universal [place] password for that web site. If you do not have the password, please call the hospital operator.  07/29/2021, 12:16 PM

## 2021-07-30 LAB — BASIC METABOLIC PANEL
Anion gap: 10 (ref 5–15)
BUN: 24 mg/dL — ABNORMAL HIGH (ref 8–23)
CO2: 23 mmol/L (ref 22–32)
Calcium: 9.2 mg/dL (ref 8.9–10.3)
Chloride: 100 mmol/L (ref 98–111)
Creatinine, Ser: 1.53 mg/dL — ABNORMAL HIGH (ref 0.61–1.24)
GFR, Estimated: 50 mL/min — ABNORMAL LOW (ref >60)
Glucose, Bld: 285 mg/dL — ABNORMAL HIGH (ref 70–99)
Potassium: 4.4 mmol/L (ref 3.5–5.1)
Sodium: 133 mmol/L — ABNORMAL LOW (ref 135–145)

## 2021-07-30 LAB — GLUCOSE, CAPILLARY
Glucose-Capillary: 301 mg/dL — ABNORMAL HIGH (ref 70–99)
Glucose-Capillary: 341 mg/dL — ABNORMAL HIGH (ref 70–99)
Glucose-Capillary: 269 mg/dL — ABNORMAL HIGH (ref 70–99)
Glucose-Capillary: 122 mg/dL — ABNORMAL HIGH (ref 70–99)

## 2021-07-30 MED ORDER — INSULIN ASPART PROT & ASPART (70-30 MIX) 100 UNIT/ML ~~LOC~~ SUSP
40.0000 [IU] | Freq: Two times a day (BID) | SUBCUTANEOUS | Status: DC
  Administered 2021-07-30 – 2021-07-31 (×2): 40 [IU] via SUBCUTANEOUS
  Filled 2021-07-30: qty 10

## 2021-07-30 MED ORDER — METHYLPREDNISOLONE ACETATE 40 MG/ML IJ SUSP
40.0000 mg | Freq: Once | INTRAMUSCULAR | Status: AC
  Administered 2021-07-30: 40 mg via INTRA_ARTICULAR
  Filled 2021-07-30 (×3): qty 1

## 2021-07-30 MED ORDER — BUPIVACAINE HCL (PF) 0.5 % IJ SOLN
10.0000 mL | Freq: Once | INTRAMUSCULAR | Status: AC
  Administered 2021-07-30: 10 mL
  Filled 2021-07-30 (×3): qty 10

## 2021-07-30 NOTE — Progress Notes (Signed)
PROGRESS NOTE        PATIENT DETAILS Name: Wayne Wood Age: 65 y.o. Sex: male Date of Birth: 1956/01/03 Admit Date: 07/24/2021 Admitting Physician Joseph Art, DO YTK:ZSWFU, Marlynn Perking., FNP  Brief Narrative: Patient is a 65 y.o. male with history of HTN, HLD, GERD, DM-2 presented with AMS and subsequent seizure-thought to have provoked seizures due to uncontrolled hyperglycemia.  Subjective: Left ankle pain better after IV steroids yesterday-however developed severe hyperglycemia.  Objective: Vitals: Blood pressure 132/79, pulse 95, temperature 98.3 F (36.8 C), temperature source Oral, resp. rate 19, height 5\' 10"  (1.778 m), weight 103.4 kg, SpO2 94 %.   Exam: Gen Exam:Alert awake-not in any distress HEENT:atraumatic, normocephalic Chest: B/L clear to auscultation anteriorly CVS:S1S2 regular Abdomen:soft non tender, non distended Extremities:no edema Neurology: Non focal Skin: no rash   Pertinent Labs/Radiology: Na: 133 Creatinine: 1.53  9/25>> MRI brain: No acute intracranial abnormality. 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   Assessment/Plan: Seizure: Provoked by hyperglycemia-no further seizures after correction of hypoglycemia-neurology recommends starting AED/EEG if seizure reoccurs.  Acute metabolic encephalopathy: Due to combination of hypoglycemia/postictal state-resolved-patient is completely awake and alert.  AKI: Likely hemodynamically mediated-improving  Uncontrolled DM-2 with hyperglycemia (A1c 11.7 on 9/25): CBGs was significantly elevated after 1 dose of IV Solu-Medrol yesterday-CBGs remain on the higher side-we will increase insulin 70/30 to 40 units twice daily.    Recent Labs    07/29/21 2044 07/30/21 0730 07/30/21 1220  GLUCAP 336* 301* 341*     HLD: Continue statin  Left ankle pain: Claims to have fallen  at home-ankle x-ray/CT scan negative for fractures-apparently has a history of gout-no response to colchicine-somewhat better after IV steroids that was given yesterday-but caused severe hyperglycemia.  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  Have reached out to orthopedics to see if patient will be a candidate for intra-articular steroid injection.    GERD: Continue PPI  BPH: Continue Flomax  Other: Initially resistant to the idea of going to SNF-however today-thinks that he will not be able to make it at home as he lives on the third floor-and has requested SNF.  Have consulted social work today.  Obesity: Estimated body mass index is 32.71 kg/m as calculated from the following:   Height as of this encounter: 5\' 10"  (1.778 m).   Weight as of this encounter: 103.4 kg.    Procedures: None Consults: None DVT Prophylaxis: Lovenox Code Status:Full code  Family Communication: None at bedside  Time spent: 25 minutes-Greater than 50% of this time was spent in counseling, explanation of diagnosis, planning of further management, and coordination of care.  Diet: Diet Order             Diet heart healthy/carb modified Room service appropriate? Yes; Fluid consistency: Thin  Diet effective now                      Disposition Plan: Status is: Inpatient  Remains inpatient appropriate because:Inpatient level of care appropriate due to severity of illness  Dispo:  Patient From: Home  Planned Disposition: Home with Health Care Svc vs SNF  Medically stable for discharge: No      Barriers to Discharge: Optimize left ankle pain-initially resistant  to SNF-but given ongoing issues with left ankle pain etc.-he now reluctantly acknowledged that he would likely require SNF on discharge.  Antimicrobial agents: Anti-infectives (From admission, onward)    None        MEDICATIONS: Scheduled Meds:  bupivacaine  10 mL Infiltration Once    colchicine  0.6 mg Oral Daily   enoxaparin (LOVENOX) injection  40 mg Subcutaneous Daily   folic acid  1 mg Oral Daily   insulin aspart  0-15 Units Subcutaneous TID WC   insulin aspart  0-5 Units Subcutaneous QHS   insulin aspart protamine- aspart  30 Units Subcutaneous BID WC   latanoprost  1 drop Both Eyes q morning   lidocaine  1 patch Transdermal Q24H   methylPREDNISolone acetate  40 mg Intra-articular Once   multivitamin with minerals  1 tablet Oral Daily   pantoprazole  40 mg Oral Daily   simvastatin  40 mg Oral QHS   tamsulosin  0.4 mg Oral Daily   thiamine  100 mg Oral Daily   Continuous Infusions: PRN Meds:.acetaminophen, LORazepam   I have personally reviewed following labs and imaging studies  LABORATORY DATA: CBC: Recent Labs  Lab 07/24/21 2226 07/25/21 0017 07/25/21 0132 07/25/21 0204 07/25/21 0549  WBC 10.7*  --   --  11.7* 11.7*  NEUTROABS 7.2  --   --   --   --   HGB 13.0 14.6  14.3 12.2* 12.5* 12.5*  HCT 38.8* 43.0  42.0 36.0* 37.5* 37.6*  MCV 91.5  --   --  89.5 90.0  PLT 229  --   --  226 227     Basic Metabolic Panel: Recent Labs  Lab 07/24/21 2226 07/25/21 0017 07/25/21 0420 07/26/21 1346 07/27/21 0105 07/28/21 0028 07/30/21 0729  NA 132*   < > 134* 136 137 132* 133*  K 4.1   < > 4.1 3.8 3.4* 3.6 4.4  CL 98   < > 101 100 101 100 100  CO2 16*  --  22 26 27 23 23   GLUCOSE 561*   < > 507* 347* 168* 202* 285*  BUN 13   < > 13 9 7* 11 24*  CREATININE 1.97*   < > 1.62* 1.48* 1.40* 1.41* 1.53*  CALCIUM 9.4  --  9.1 9.2 9.2 8.8* 9.2  MG 2.0  --   --   --   --   --   --    < > = values in this interval not displayed.     GFR: Estimated Creatinine Clearance: 58 mL/min (A) (by C-G formula based on SCr of 1.53 mg/dL (H)).  Liver Function Tests: Recent Labs  Lab 07/24/21 2226  AST 27  ALT 22  ALKPHOS 100  BILITOT 0.6  PROT 7.1  ALBUMIN 3.4*    No results for input(s): LIPASE, AMYLASE in the last 168 hours. Recent Labs  Lab  07/24/21 2310  AMMONIA 60*     Coagulation Profile: Recent Labs  Lab 07/24/21 2226  INR 1.0     Cardiac Enzymes: No results for input(s): CKTOTAL, CKMB, CKMBINDEX, TROPONINI in the last 168 hours.  BNP (last 3 results) No results for input(s): PROBNP in the last 8760 hours.  Lipid Profile: No results for input(s): CHOL, HDL, LDLCALC, TRIG, CHOLHDL, LDLDIRECT in the last 72 hours.  Thyroid Function Tests: No results for input(s): TSH, T4TOTAL, FREET4, T3FREE, THYROIDAB in the last 72 hours.  Anemia Panel: No results for input(s): VITAMINB12, FOLATE, FERRITIN, TIBC, IRON,  RETICCTPCT in the last 72 hours.  Urine analysis:    Component Value Date/Time   COLORURINE STRAW (A) 07/25/2021 0026   APPEARANCEUR CLEAR 07/25/2021 0026   LABSPEC 1.021 07/25/2021 0026   PHURINE 5.0 07/25/2021 0026   GLUCOSEU >=500 (A) 07/25/2021 0026   HGBUR MODERATE (A) 07/25/2021 0026   BILIRUBINUR NEGATIVE 07/25/2021 0026   KETONESUR 5 (A) 07/25/2021 0026   PROTEINUR NEGATIVE 07/25/2021 0026   UROBILINOGEN 1.0 06/22/2011 1950   NITRITE NEGATIVE 07/25/2021 0026   LEUKOCYTESUR NEGATIVE 07/25/2021 0026    Sepsis Labs: Lactic Acid, Venous    Component Value Date/Time   LATICACIDVEN 0.7 06/23/2011 0105    MICROBIOLOGY: Recent Results (from the past 240 hour(s))  Resp Panel by RT-PCR (Flu A&B, Covid) Nasopharyngeal Swab     Status: None   Collection Time: 07/24/21 10:26 PM   Specimen: Nasopharyngeal Swab; Nasopharyngeal(NP) swabs in vial transport medium  Result Value Ref Range Status   SARS Coronavirus 2 by RT PCR NEGATIVE NEGATIVE Final    Comment: (NOTE) SARS-CoV-2 target nucleic acids are NOT DETECTED.  The SARS-CoV-2 RNA is generally detectable in upper respiratory specimens during the acute phase of infection. The lowest concentration of SARS-CoV-2 viral copies this assay can detect is 138 copies/mL. A negative result does not preclude SARS-Cov-2 infection and should not be used  as the sole basis for treatment or other patient management decisions. A negative result may occur with  improper specimen collection/handling, submission of specimen other than nasopharyngeal swab, presence of viral mutation(s) within the areas targeted by this assay, and inadequate number of viral copies(<138 copies/mL). A negative result must be combined with clinical observations, patient history, and epidemiological information. The expected result is Negative.  Fact Sheet for Patients:  BloggerCourse.com  Fact Sheet for Healthcare Providers:  SeriousBroker.it  This test is no t yet approved or cleared by the [place] FDA and  has been authorized for detection and/or diagnosis of SARS-CoV-2 by FDA under an Emergency Use Authorization (EUA). This EUA will remain  in effect (meaning this test can be used) for the duration of the COVID-19 declaration under Section 564(b)(1) of the Act, 21 U.S.C.section 360bbb-3(b)(1), unless the authorization is terminated  or revoked sooner.       Influenza A by PCR NEGATIVE NEGATIVE Final   Influenza B by PCR NEGATIVE NEGATIVE Final    Comment: (NOTE) The Xpert Xpress SARS-CoV-2/FLU/RSV plus assay is intended as an aid in the diagnosis of influenza from Nasopharyngeal swab specimens and should not be used as a sole basis for treatment. Nasal washings and aspirates are unacceptable for Xpert Xpress SARS-CoV-2/FLU/RSV testing.  Fact Sheet for Patients: BloggerCourse.com  Fact Sheet for Healthcare Providers: SeriousBroker.it  This test is not yet approved or cleared by the [place] FDA and has been authorized for detection and/or diagnosis of SARS-CoV-2 by FDA under an Emergency Use Authorization (EUA). This EUA will remain in effect (meaning this test can be used) for the duration of the COVID-19 declaration under Section 564(b)(1)  of the Act, 21 U.S.C. section 360bbb-3(b)(1), unless the authorization is terminated or revoked.  Performed at [facility], [address], [place], [place] [address]     RADIOLOGY STUDIES/RESULTS: CT ANKLE LEFT WO CONTRAST  Result Date: 07/28/2021 CLINICAL DATA:  Severe ankle pain status post fall. EXAM: CT OF THE LEFT ANKLE WITHOUT CONTRAST TECHNIQUE: Multidetector CT imaging of the left ankle was performed according to the standard protocol. Multiplanar CT image reconstructions were also  generated. COMPARISON:  None. FINDINGS: Bones/Joint/Cartilage No fracture or dislocation. Normal alignment. No joint effusion. Mild osteoarthritis of the tibiotalar joint. Osteochondral lesions involving the medial and lateral corner of the talar dome. 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 Mild osteoarthritis of the subtalar joints. Moderate osteoarthritis of the talonavicular joint. Small plantar calcaneal spur. Ligaments Ligaments are suboptimally evaluated by CT. Muscles and Tendons Muscles are normal. No muscle atrophy. No intramuscular fluid collection or hematoma. Flexor, extensor, peroneal and Achilles tendons are intact. Soft tissue No fluid collection or hematoma. No soft tissue mass. Small tissue wounds involving the medial aspect of the ankle with edema in the subcutaneous fat concerning for cellulitis. IMPRESSION: 1.  No acute osseous injury of the left ankle. 2. Mild osteoarthritis of the tibiotalar joint. 3. Osteochondral lesions involving the medial and lateral corner of the talar dome. 4. Mild osteoarthritis of the subtalar joints. Moderate osteoarthritis of the talonavicular joint. 5. Small tissue wounds involving the medial aspect of the ankle with edema in the subcutaneous fat concerning for cellulitis. Electronically Signed   By: Elige Ko M.D.   On: 07/28/2021 17:54     LOS: 5 days   Jeoffrey Massed, MD  [facility]    To contact the attending provider  between 7A-7P or the covering provider during after hours 7P-7A, please log into the web site [email or web address] and access using universal [place] password for that web site. If you do not have the password, please call the hospital operator.  07/30/2021, 1:37 PM

## 2021-07-30 NOTE — Procedures (Signed)
Procedure: Left ankle aspiration and injection   Indication: Left ankle pain    Surgeon: Charma Igo, PA-C   Assist: None   Anesthesia: Topical refrigerant   EBL: None   Complications: Dry tap   Findings: After risks/benefits explained patient desires to undergo procedure. Consent obtained and time out performed. The left ankle was sterilely prepped and aspirated. Only got microdroplet of fluid. 61ml 0.5% Marcaine and 40mg  depomedrol instilled. Pt tolerated the procedure well.       , PA-C Orthopedic Surgery [phone]

## 2021-07-30 NOTE — TOC Progression Note (Signed)
Transition of Care [facility]) - Progression Note    Patient Details  Name: Wayne Wood MRN: [id number] Date of Birth: 1956-02-18  Transition [facility] Care [facility]) CM/SW Contact  Mearl Latin, LCSW Phone Number: 07/30/2021, 5:01 PM  Clinical Narrative:    Patient now requesting SNF placement as he reports being unable to go up the stairs to his apartment. CSW sent out referral for review. Will present bed offers as available.   Expected Discharge Plan: Skilled Nursing Facility Barriers to Discharge: English as a second language teacher, Continued Medical Work up, SNF Pending bed offer  Expected Discharge Plan and Services Expected Discharge Plan: Skilled Nursing Facility In-house Referral: Clinical Social Work   Post Acute Care Choice: Skilled Nursing Facility Living arrangements for the past 2 months: Apartment                                       Social Determinants of Health (SDOH) Interventions    Readmission Risk Interventions No flowsheet data found.

## 2021-07-30 NOTE — Progress Notes (Addendum)
Inpatient Diabetes Program Recommendations  AACE/ADA: New Consensus Statement on Inpatient Glycemic Control (2015)  Target Ranges:  Prepandial:   less than 140 mg/dL      Peak postprandial:   less than 180 mg/dL (1-2 hours)      Critically ill patients:  140 - 180 mg/dL   Lab Results  Component Value Date   GLUCAP 301 (H) 07/30/2021   HGBA1C 11.7 (H) 07/25/2021    Review of Glycemic Control Results for Wayne Wood, Wayne Wood (MRN [id number]) as of 07/30/2021 10:11  Ref. Range 07/29/2021 15:56 07/29/2021 15:59 07/29/2021 20:44 07/30/2021 07:30  Glucose-Capillary Latest Ref Range: 70 - 99 mg/dL >177 (HH) 939 (H) 030 (H) 301 (H)   Diabetes history: Type 2 DM Outpatient Diabetes medications: Amaryl 4 mg QD, Metformin 1000 mg bid, Trulicity 0.75 mg qwk Current orders for Inpatient glycemic control: 70/30 30 units bid, Novolog 0-15 units TID & HS   A1c 11.7%   Inpatient Diabetes Program Recommendations:     Noted significant hyperglycemia yesterday. Patient was administered Solumedrol 60 mg and missed AM dose of Novolog 70/30 due to dose increase.  Given dose adjustment and steroids tapered to intraarticular anticipate improvement.  Consider increasing Novolog 70/30 35 units BID.   Spoke with pt at bedside on 9/27   At time of d/c: -  Novolin ReliOn mix flexpen order # [id number] -  insulin pen needles order # [id number] -  pt prefers Accu check glucometer   Thanks, Lujean Rave, MSN, RNC-OB Diabetes Coordinator [phone] (8a-5p)

## 2021-07-30 NOTE — Progress Notes (Signed)
Physical Therapy Treatment Patient Details Name: Wayne Wood MRN: [id number] DOB: 1956/02/26 Today's Date: 07/30/2021   History of Present Illness Pt is a 65 yo male who presented to [facility] ED due to altered mental status and suspected seizure thought to be from hyperglycemia. + left ankle pain; xray negative for acute injury  PMH includes essential hypertension, hyperlipidemia, GERD, type 2 diabetes, obesity.    PT Comments    Pt received in bed, agreeable to participation in therapy. Pt reports he was able to ambulate to sink this AM with NT to wash up. During PT session, pt required min assist sit to stand, and min assist ambulation 30' with RW. Pt demonstrates heavy reliance on BUE through RW due to L ankle pain. Good progress noted though as this is the first time pt has tolerated ambulation. Pt returned to bed at end of session. He is pleased with his progress and hopeful to be able to return home soon.    Recommendations for follow up therapy are one component of a multi-disciplinary discharge planning process, led by the attending physician.  Recommendations may be updated based on patient status, additional functional criteria and insurance authorization.  Follow Up Recommendations  SNF (may improve with pain management)     Equipment Recommendations  Rolling walker with 5" wheels    Recommendations for Other Services       Precautions / Restrictions Precautions Precautions: Fall;Other (comment) Precaution Comments: h/o recent seizure Restrictions Other Position/Activity Restrictions: bilateral ankle pain, L more significant than R     Mobility  Bed Mobility Overal bed mobility: Needs Assistance Bed Mobility: Supine to Sit;Sit to Supine     Supine to sit: Supervision;HOB elevated Sit to supine: Supervision;HOB elevated   General bed mobility comments: increased time, use of rails    Transfers Overall transfer level: Needs assistance Equipment used: Rolling  walker (2 wheeled) Transfers: Sit to/from Stand Sit to Stand: Min assist;From elevated surface         General transfer comment: increased time to power up  Ambulation/Gait Ambulation/Gait assistance: Min assist Gait Distance (Feet): 30 Feet Assistive device: Rolling walker (2 wheeled) Gait Pattern/deviations: Step-through pattern;Antalgic;Decreased stride length;Trunk flexed Gait velocity: decreased   General Gait Details: slow, guarded gait; heavy reliance on BUE   Stairs             Wheelchair Mobility    Modified Rankin (Stroke Patients Only)       Balance Overall balance assessment: Needs assistance Sitting-balance support: No upper extremity supported;Feet supported Sitting balance-Leahy Scale: Good     Standing balance support: Bilateral upper extremity supported;During functional activity Standing balance-Leahy Scale: Poor Standing balance comment: reliant on UE support of walker                            Cognition Arousal/Alertness: Awake/alert Behavior During Therapy: WFL for tasks assessed/performed Overall Cognitive Status: Within Functional Limits for tasks assessed                                        Exercises      General Comments General comments (skin integrity, edema, etc.): max HR 118 during mobility      Pertinent Vitals/Pain Pain Assessment: Faces Faces Pain Scale: Hurts little more Pain Location: L ankle Pain Descriptors / Indicators: Grimacing;Guarding Pain Intervention(s): Monitored during session;Repositioned  Home Living                      Prior Function            PT Goals (current goals can now be found in the care plan section) Acute Rehab PT Goals Patient Stated Goal: home Progress towards PT goals: Progressing toward goals    Frequency    Min 3X/week      PT Plan Current plan remains appropriate    Co-evaluation              AM-PAC PT "6 Clicks"  Mobility   Outcome Measure  Help needed turning from your back to your side while in a flat bed without using bedrails?: None Help needed moving from lying on your back to sitting on the side of a flat bed without using bedrails?: A Little Help needed moving to and from a bed to a chair (including a wheelchair)?: A Little Help needed standing up from a chair using your arms (e.g., wheelchair or bedside chair)?: A Little Help needed to walk in hospital room?: A Little Help needed climbing 3-5 steps with a railing? : Total 6 Click Score: 17    End of Session Equipment Utilized During Treatment: Gait belt Activity Tolerance: Patient tolerated treatment well Patient left: in bed;with call bell/phone within reach Nurse Communication: Mobility status PT Visit Diagnosis: Difficulty in walking, not elsewhere classified (R26.2);Pain Pain - Right/Left: Left Pain - part of body: Ankle and joints of foot     Time: 1054-1110 PT Time Calculation (min) (ACUTE ONLY): 16 min  Charges:  $Gait Training: 8-22 mins                     Aida Raider, PT  Office # [phone] Pager [phone]    Ilda Foil 07/30/2021, 12:35 PM

## 2021-07-30 NOTE — Consult Note (Signed)
Reason for Consult:Left ankle pain Referring Physician: Jeoffrey Wood Time called: [id number] Time at bedside: 1430   Wayne Wood is an 65 y.o. male.  HPI: Wayne Wood has been in the hospital for 5d with seizure likely 2/2 hyperglycemia. He has been c/o left ankle pain which has limited his mobility. He improved on oral steroids but his CBG's were uncontrollable and that was stopped. He denies fevers, chills, sweats, N/V. He has a hx/o gout.   Past Medical History:  Diagnosis Date   Diabetes mellitus without complication (HCC)    GERD (gastroesophageal reflux disease)    Gout    Hyperlipidemia    Hypertension    Hypertension    Obesity     History reviewed. No pertinent surgical history.  No family history on file.  Social History:  reports that he has never smoked. He has never used smokeless tobacco. He reports that he does not drink alcohol and does not use drugs.  Allergies: No Known Allergies  Medications: I have reviewed the patient's current medications.  Results for orders placed or performed during the hospital encounter of 07/24/21 (from the past 48 hour(s))  Glucose, capillary     Status: Abnormal   Collection Time: 07/28/21  5:22 PM  Result Value Ref Range   Glucose-Capillary 254 (H) 70 - 99 mg/dL    Comment: Glucose reference range applies only to samples taken after fasting for at least 8 hours.  Glucose, capillary     Status: Abnormal   Collection Time: 07/28/21  8:45 PM  Result Value Ref Range   Glucose-Capillary 231 (H) 70 - 99 mg/dL    Comment: Glucose reference range applies only to samples taken after fasting for at least 8 hours.  Glucose, capillary     Status: Abnormal   Collection Time: 07/29/21  7:28 AM  Result Value Ref Range   Glucose-Capillary 258 (H) 70 - 99 mg/dL    Comment: Glucose reference range applies only to samples taken after fasting for at least 8 hours.  Glucose, capillary     Status: Abnormal   Collection Time: 07/29/21 12:24 PM   Result Value Ref Range   Glucose-Capillary 451 (H) 70 - 99 mg/dL    Comment: Glucose reference range applies only to samples taken after fasting for at least 8 hours.  Glucose, capillary     Status: Abnormal   Collection Time: 07/29/21  3:56 PM  Result Value Ref Range   Glucose-Capillary >600 (HH) 70 - 99 mg/dL    Comment: Glucose reference range applies only to samples taken after fasting for at least 8 hours.  Glucose, capillary     Status: Abnormal   Collection Time: 07/29/21  3:59 PM  Result Value Ref Range   Glucose-Capillary 454 (H) 70 - 99 mg/dL    Comment: Glucose reference range applies only to samples taken after fasting for at least 8 hours.  Glucose, capillary     Status: Abnormal   Collection Time: 07/29/21  8:44 PM  Result Value Ref Range   Glucose-Capillary 336 (H) 70 - 99 mg/dL    Comment: Glucose reference range applies only to samples taken after fasting for at least 8 hours.  Basic metabolic panel     Status: Abnormal   Collection Time: 07/30/21  7:29 AM  Result Value Ref Range   Sodium 133 (L) 135 - 145 mmol/L   Potassium 4.4 3.5 - 5.1 mmol/L   Chloride 100 98 - 111 mmol/L   CO2 23  22 - 32 mmol/L   Glucose, Bld 285 (H) 70 - 99 mg/dL    Comment: Glucose reference range applies only to samples taken after fasting for at least 8 hours.   BUN 24 (H) 8 - 23 mg/dL   Creatinine, Ser 1.06 (H) 0.61 - 1.24 mg/dL   Calcium 9.2 8.9 - 26.9 mg/dL   GFR, Estimated 50 (L) >60 mL/min    Comment: (NOTE) Calculated using the CKD-EPI Creatinine Equation (2021)    Anion gap 10 5 - 15    Comment: Performed at [facility], [address], [place], [place] [address]  Glucose, capillary     Status: Abnormal   Collection Time: 07/30/21  7:30 AM  Result Value Ref Range   Glucose-Capillary 301 (H) 70 - 99 mg/dL    Comment: Glucose reference range applies only to samples taken after fasting for at least 8 hours.  Glucose, capillary     Status: Abnormal   Collection Time:  07/30/21 12:20 PM  Result Value Ref Range   Glucose-Capillary 341 (H) 70 - 99 mg/dL    Comment: Glucose reference range applies only to samples taken after fasting for at least 8 hours.    CT ANKLE LEFT WO CONTRAST  Result Date: 07/28/2021 CLINICAL DATA:  Severe ankle pain status post fall. EXAM: CT OF THE LEFT ANKLE WITHOUT CONTRAST TECHNIQUE: Multidetector CT imaging of the left ankle was performed according to the standard protocol. Multiplanar CT image reconstructions were also generated. COMPARISON:  None. FINDINGS: Bones/Joint/Cartilage No fracture or dislocation. Normal alignment. No joint effusion. Mild osteoarthritis of the tibiotalar joint. Osteochondral lesions involving the medial and lateral corner of the talar dome. 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 Mild osteoarthritis of the subtalar joints. Moderate osteoarthritis of the talonavicular joint. Small plantar calcaneal spur. Ligaments Ligaments are suboptimally evaluated by CT. Muscles and Tendons Muscles are normal. No muscle atrophy. No intramuscular fluid collection or hematoma. Flexor, extensor, peroneal and Achilles tendons are intact. Soft tissue No fluid collection or hematoma. No soft tissue mass. Small tissue wounds involving the medial aspect of the ankle with edema in the subcutaneous fat concerning for cellulitis. IMPRESSION: 1.  No acute osseous injury of the left ankle. 2. Mild osteoarthritis of the tibiotalar joint. 3. Osteochondral lesions involving the medial and lateral corner of the talar dome. 4. Mild osteoarthritis of the subtalar joints. Moderate osteoarthritis of the talonavicular joint. 5. Small tissue wounds involving the medial aspect of the ankle with edema in the subcutaneous fat concerning for cellulitis. Electronically Signed   By: Elige Ko M.D.   On: 07/28/2021 17:54    Review of Systems  Constitutional:  Negative for chills, diaphoresis and fever.  HENT:  Negative for ear discharge, ear  pain, hearing loss and tinnitus.   Eyes:  Negative for photophobia and pain.  Respiratory:  Negative for cough and shortness of breath.   Cardiovascular:  Negative for chest pain.  Gastrointestinal:  Negative for abdominal pain, nausea and vomiting.  Genitourinary:  Negative for dysuria, flank pain, frequency and urgency.  Musculoskeletal:  Positive for arthralgias (Left ankle). Negative for back pain, myalgias and neck pain.  Neurological:  Negative for dizziness and headaches.  Hematological:  Does not bruise/bleed easily.  Psychiatric/Behavioral:  The patient is not nervous/anxious.   Blood pressure 132/79, pulse 95, temperature 98.3 F (36.8 C), temperature source Oral, resp. rate 19, height 5\' 10"  (1.778 m), weight 103.4 kg, SpO2 94 %. Physical Exam Constitutional:  General: He is not in acute distress.    Appearance: He is well-developed. He is not diaphoretic.  HENT:     Head: Normocephalic and atraumatic.  Eyes:     General: No scleral icterus.       Right eye: No discharge.        Left eye: No discharge.     Conjunctiva/sclera: Conjunctivae normal.  Cardiovascular:     Rate and Rhythm: Normal rate and regular rhythm.  Pulmonary:     Effort: Pulmonary effort is normal. No respiratory distress.  Musculoskeletal:     Cervical back: Normal range of motion.     Comments: LLE No traumatic wounds, ecchymosis, or rash  Ankle NT, AROM/PROM painless through full ROM  No knee or ankle effusion  Knee stable to varus/ valgus and anterior/posterior stress  Sens DPN, SPN, TN intact  Motor EHL, ext, flex, evers 5/5  DP 2+, PT 1+, No significant edema  Skin:    General: Skin is warm and dry.  Neurological:     Mental Status: He is alert.  Psychiatric:        Mood and Affect: Mood normal.        Behavior: Behavior normal.    Assessment/Plan: Left ankle pain -- Given his painless ROM I'm hopeful this is just his arthritis. I have injected his ankle and hope that will bring  some relief.    Freeman Caldron, PA-C Orthopedic Surgery [phone] 07/30/2021, 2:47 PM

## 2021-07-30 NOTE — NC FL2 (Signed)
[place] MEDICAID FL2 LEVEL OF CARE SCREENING TOOL     IDENTIFICATION  Patient Name: Wayne Wood Birthdate: 1956-06-25 Sex: male Admission Date (Current Location): 07/24/2021  [facility] and [place] Number:  Producer, television/film/video and Address:  [facility] [place], [address] [place], [place] [address]      Provider Number: [id number]  Attending Physician Name and Address:  Maretta Bees, MD  Relative Name and Phone Number:       Current Level of Care: Hospital Recommended Level of Care: Skilled Nursing Facility Prior Approval Number:    Date Approved/Denied:   PASRR Number: [id number]  Discharge Plan: SNF    Current Diagnoses: Patient Active Problem List   Diagnosis Date Noted   Seizure (HCC) 07/25/2021   DM (diabetes mellitus), type 2, uncontrolled (HCC) 07/25/2021   Chronic congestive heart failure (HCC) 07/12/2017   Essential hypertension 07/12/2017   Diabetes mellitus without complication (HCC) 07/12/2017   Chronic gout of foot 07/12/2017   Plantar fasciitis 07/12/2017    Orientation RESPIRATION BLADDER Height & Weight     Self, Time, Situation, Place  Normal Continent Weight: 227 lb 15.3 oz (103.4 kg) Height:  5\' 10"  (177.8 cm)  BEHAVIORAL SYMPTOMS/MOOD NEUROLOGICAL BOWEL NUTRITION STATUS      Continent Diet (see dc summary)  AMBULATORY STATUS COMMUNICATION OF NEEDS Skin   Limited Assist Verbally Normal                       Personal Care Assistance Level of Assistance  Bathing, Feeding, Dressing Bathing Assistance: Independent Feeding assistance: Independent Dressing Assistance: Independent     Functional Limitations Info             SPECIAL CARE FACTORS FREQUENCY  PT (By licensed PT), OT (By licensed OT)     PT Frequency: 5x/week OT Frequency: 5x/week            Contractures Contractures Info: Not present    Additional Factors Info  Code Status, Allergies, Insulin Sliding Scale Code Status  Info: Full Allergies Info: NKA   Insulin Sliding Scale Info: see dc summary       Current Medications (07/30/2021):  This is the current hospital active medication list Current Facility-Administered Medications  Medication Dose Route Frequency Provider Last Rate Last Admin   acetaminophen (TYLENOL) tablet 650 mg  650 mg Oral Q4H PRN 08/01/2021, MD   650 mg at 07/28/21 2124   bupivacaine (MARCAINE) 0.5 % injection 10 mL  10 mL Infiltration Once 2125, PA-C       colchicine tablet 0.6 mg  0.6 mg Oral Daily Freeman Caldron, MD   0.6 mg at 07/30/21 0816   enoxaparin (LOVENOX) injection 40 mg  40 mg Subcutaneous Daily 08/01/21 N, DO   40 mg at 07/30/21 08/01/21   folic acid (FOLVITE) tablet 1 mg  1 mg Oral Daily Muthersbaugh, Hannah, PA-C   1 mg at 07/30/21 0816   insulin aspart (novoLOG) injection 0-15 Units  0-15 Units Subcutaneous TID WC 12-30-1984 U, DO   11 Units at 07/30/21 1352   insulin aspart (novoLOG) injection 0-5 Units  0-5 Units Subcutaneous QHS 08/01/21 U, DO   4 Units at 07/29/21 2108   insulin aspart protamine- aspart (NOVOLOG MIX 70/30) injection 40 Units  40 Units Subcutaneous BID WC Ghimire, Shanker M, MD       latanoprost (XALATAN) 0.005 % ophthalmic solution 1 drop  1 drop Both Eyes q morning Marlin Canary U, DO   1 drop at 07/30/21 0825   lidocaine (LIDODERM) 5 % 1 patch  1 patch Transdermal Q24H Marlin Canary U, DO   1 patch at 07/30/21 0818   LORazepam (ATIVAN) injection 3 mg  3 mg Intravenous Q4H PRN Dow Adolph N, DO       methylPREDNISolone acetate (DEPO-MEDROL) injection 40 mg  40 mg Intra-articular Once Freeman Caldron, PA-C       multivitamin with minerals tablet 1 tablet  1 tablet Oral Daily Muthersbaugh, Dahlia Client, PA-C   1 tablet at 07/30/21 0816   pantoprazole (PROTONIX) EC tablet 40 mg  40 mg Oral Daily Marlin Canary U, DO   40 mg at 07/30/21 0816   simvastatin (ZOCOR) tablet 40 mg  40 mg Oral QHS Vann, Jessica U, DO   40 mg at  07/29/21 2108   tamsulosin (FLOMAX) capsule 0.4 mg  0.4 mg Oral Daily Marlin Canary U, DO   0.4 mg at 07/30/21 9935   thiamine tablet 100 mg  100 mg Oral Daily Muthersbaugh, Dahlia Client, PA-C   100 mg at 07/30/21 7017     Discharge Medications: Please see discharge summary for a list of discharge medications.  Relevant Imaging Results:  Relevant Lab Results:   Additional Information SSN: [id number]. Moderna booster 05/03/21.  Mearl Latin, LCSW

## 2021-07-30 NOTE — Progress Notes (Signed)
Occupational Therapy Treatment Patient Details Name: Wayne Wood MRN: [id number] DOB: [birth year] Today's Date: 07/30/2021   History of present illness Pt is a 65 yo male who presented to [facility] ED due to altered mental status and suspected seizure thought to be from hyperglycemia. + left ankle pain; xray negative for acute injury  PMH includes essential hypertension, hyperlipidemia, GERD, type 2 diabetes, obesity.   OT comments  Pt progressing towards acute OT goals. Focus of session was walking distance to/from bathroom and functional transfer. Pt with slow, guarded movement and up to min A to steady. D/c plan remains appropriate.    Recommendations for follow up therapy are one component of a multi-disciplinary discharge planning process, led by the attending physician.  Recommendations may be updated based on patient status, additional functional criteria and insurance authorization.    Follow Up Recommendations  SNF    Equipment Recommendations  Other (comment) (defer to next venue)    Recommendations for Other Services      Precautions / Restrictions Precautions Precautions: Fall;Other (comment) Precaution Comments: h/o recent seizure Restrictions Weight Bearing Restrictions: No Other Position/Activity Restrictions: bilateral ankle pain, L more significant than R       Mobility Bed Mobility Overal bed mobility: Needs Assistance Bed Mobility: Supine to Sit;Sit to Supine     Supine to sit: Supervision;HOB elevated Sit to supine: Supervision;HOB elevated   General bed mobility comments: increased time, use of rails    Transfers Overall transfer level: Needs assistance Equipment used: Rolling walker (2 wheeled) Transfers: Sit to/from Stand Sit to Stand: Min assist;From elevated surface;Mod assist         General transfer comment: min A from elevated EOB height. assist to fully power up and steady    Balance Overall balance assessment: Needs  assistance Sitting-balance support: No upper extremity supported;Feet supported Sitting balance-Leahy Scale: Good     Standing balance support: Bilateral upper extremity supported;During functional activity Standing balance-Leahy Scale: Poor Standing balance comment: reliant on UE support of walker                           ADL either performed or assessed with clinical judgement   ADL Overall ADL's : Needs assistance/impaired                         Toilet Transfer: Moderate assistance;Ambulation;Comfort height toilet;RW;Grab bars Toilet Transfer Details (indicate cue type and reason): simulated in the room with bathroom distance mobility and sit<>stands from EOB           General ADL Comments: Min-mod A to steady during transfers depending on seat height. Instability noted while walking; slow, guarded movements,up min A on turns.     Vision       Perception     Praxis      Cognition Arousal/Alertness: Awake/alert Behavior During Therapy: WFL for tasks assessed/performed Overall Cognitive Status: No family/caregiver present to determine baseline cognitive functioning                                 General Comments: WFL for basic ADL tasks        Exercises     Shoulder Instructions       General Comments      Pertinent Vitals/ Pain       Pain Assessment: Faces Faces Pain Scale: Hurts little more  Pain Location: unspecified, grimacing while walking Pain Descriptors / Indicators: Grimacing Pain Intervention(s): Monitored during session  Home Living                                          Prior Functioning/Environment              Frequency  Min 2X/week        Progress Toward Goals  OT Goals(current goals can now be found in the care plan section)  Progress towards OT goals: Progressing toward goals  Acute Rehab OT Goals Patient Stated Goal: home OT Goal Formulation: With patient Time  For Goal Achievement: 08/10/21 Potential to Achieve Goals: Good ADL Goals Pt Will Perform Grooming: with min guard assist;with set-up;sitting Pt Will Perform Upper Body Dressing: with set-up;sitting;with min guard assist Pt Will Perform Lower Body Dressing: with mod assist;sit to/from stand Pt Will Transfer to Toilet: ambulating;with +2 assist;with min assist Pt Will Perform Toileting - Clothing Manipulation and hygiene: with max assist;sit to/from stand Additional ADL Goal #1: Pt will complete bed mobility at min guard level to prepare for EOB/OOB ADLs.  Plan Discharge plan remains appropriate    Co-evaluation                 AM-PAC OT "6 Clicks" Daily Activity     Outcome Measure   Help from another person eating meals?: None Help from another person taking care of personal grooming?: A Little Help from another person toileting, which includes using toliet, bedpan, or urinal?: A Lot Help from another person bathing (including washing, rinsing, drying)?: A Lot Help from another person to put on and taking off regular upper body clothing?: A Little Help from another person to put on and taking off regular lower body clothing?: A Lot 6 Click Score: 16    End of Session Equipment Utilized During Treatment: Rolling walker  OT Visit Diagnosis: Unsteadiness on feet (R26.81);Muscle weakness (generalized) (M62.81);Other symptoms and signs involving cognitive function;Other symptoms and signs involving the nervous system (R29.898);Pain   Activity Tolerance Patient tolerated treatment well;Patient limited by fatigue   Patient Left in bed;with call bell/phone within reach;with bed alarm set   Nurse Communication Other (comment) (NT: pt needs recliner in the room for OOB option)        Time: 6767-2094 OT Time Calculation (min): 16 min  Charges: OT General Charges $OT Visit: 1 Visit OT Treatments $Self Care/Home Management : 8-22 mins  Raynald Kemp, OT Acute  Rehabilitation Services Pager: [phone] Office: [phone]   Pilar Grammes 07/30/2021, 4:41 PM

## 2021-07-31 ENCOUNTER — Encounter (HOSPITAL_COMMUNITY): Payer: Self-pay | Admitting: Internal Medicine

## 2021-07-31 LAB — GLUCOSE, CAPILLARY
Glucose-Capillary: 193 mg/dL — ABNORMAL HIGH (ref 70–99)
Glucose-Capillary: 311 mg/dL — ABNORMAL HIGH (ref 70–99)
Glucose-Capillary: 307 mg/dL — ABNORMAL HIGH (ref 70–99)
Glucose-Capillary: 296 mg/dL — ABNORMAL HIGH (ref 70–99)

## 2021-07-31 NOTE — TOC Progression Note (Signed)
Transition of Care [facility]) - Progression Note    Patient Details  Name: Wayne Wood MRN: [id number] Date of Birth: 1956/10/28  Transition of Care [facility] CM/SW [place], [place] Phone Number: 07/31/2021, 12:43 PM  Clinical Narrative:     SW met with pt at bedside to provide current BO's. Pt inquired about [facility] SNF, SW explained typically does not accept from outside [place] but the [facility] in [place] does, but pt would be further from home. SW explained unable to contact [place] over weekend to determine if they would accept outside referrals. Pt reports he lives on a top floor and understands need for rehab. Pt will review BO's with family and friends.   Expected Discharge Plan: Skilled Nursing Facility Barriers to Discharge: Ship broker, Continued Medical Work up, SNF Pending bed offer  Expected Discharge Plan and Services Expected Discharge Plan: [place] In-house Referral: Clinical Social Work   Post Acute Care Choice: [place] Living arrangements for the past 2 months: Apartment                                       Social Determinants of Health (SDOH) Interventions    Readmission Risk Interventions No flowsheet data found.

## 2021-07-31 NOTE — Progress Notes (Signed)
PROGRESS NOTE        PATIENT DETAILS Name: Wayne Wood Age: 65 y.o. Sex: male Date of Birth: July 10, 1956 Admit Date: 07/24/2021 Admitting Physician Joseph Art, DO XBW:IOMBT, Marlynn Perking., FNP  Brief Narrative: Patient is a 65 y.o. male with history of HTN, HLD, GERD, DM-2 presented with AMS and subsequent seizure-thought to have provoked seizures due to uncontrolled hyperglycemia.  Subjective: Left ankle pain is significantly improved-intra-articular steroids by orthopedics yesterday.  Objective: Vitals: Blood pressure (!) 143/89, pulse 82, temperature 98.2 F (36.8 C), temperature source Oral, resp. rate 12, height 5\' 10"  (1.778 m), weight 100.7 kg, SpO2 92 %.   Exam: Gen Exam:Alert awake-not in any distress HEENT:atraumatic, normocephalic Chest: B/L clear to auscultation anteriorly CVS:S1S2 regular Abdomen:soft non tender, non distended Extremities:no edema Neurology: Non focal Skin: no rash   Pertinent Labs/Radiology:   9/25>> MRI brain: No acute intracranial abnormality. 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   Assessment/Plan: Seizure: Provoked by hyperglycemia-no further seizures after correction of hypoglycemia-neurology recommends starting AED/EEG if seizure reoccurs.  Acute metabolic encephalopathy: Due to combination of hypoglycemia/postictal state-resolved-patient is completely awake and alert.  AKI: Likely hemodynamically mediated-improving  Uncontrolled DM-2 with hyperglycemia (A1c 11.7 on 9/25): CBGs significantly better-continue insulin 70/30 40 units twice daily.     Recent Labs    07/30/21 1604 07/30/21 2100 07/31/21 0753  GLUCAP 269* 122* 193*     HLD: Continue statin  Left ankle pain: Claims to have fallen at home-ankle x-ray/CT scan negative for fractures-apparently has a history of gout-no response to  colchicine-better with IV steroids with significant hyperglycemia.  Orthopedics consulted-subsequently underwent intra-articular steroids injection on 9/30 with significant clinical improvement.  Now able to ambulate and bear weight.  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    GERD: Continue PPI  BPH: Continue Flomax  Debility/deconditioning: Due to acute illness-plans are for SNF  Obesity: Estimated body mass index is 31.85 kg/m as calculated from the following:   Height as of this encounter: 5\' 10"  (1.778 m).   Weight as of this encounter: 100.7 kg.    Procedures: None Consults: None DVT Prophylaxis: Lovenox Code Status:Full code  Family Communication: None at bedside  Time spent: 25 minutes-Greater than 50% of this time was spent in counseling, explanation of diagnosis, planning of further management, and coordination of care.  Diet: Diet Order             Diet heart healthy/carb modified Room service appropriate? Yes; Fluid consistency: Thin  Diet effective now                      Disposition Plan: Status is: Inpatient  Remains inpatient appropriate because:Inpatient level of care appropriate due to severity of illness  Dispo:  Patient From: Home  Planned Disposition: Skilled Nursing Facility   Medically stable for discharge: Yes      Barriers to Discharge: Awaiting SNF bed Antimicrobial agents: Anti-infectives (From admission, onward)    None        MEDICATIONS: Scheduled Meds:  colchicine  0.6 mg Oral Daily   enoxaparin (LOVENOX) injection  40 mg Subcutaneous Daily   folic acid  1 mg Oral Daily   insulin aspart  0-15 Units Subcutaneous TID WC  insulin aspart  0-5 Units Subcutaneous QHS   insulin aspart protamine- aspart  40 Units Subcutaneous BID WC   latanoprost  1 drop Both Eyes q morning   lidocaine  1 patch Transdermal Q24H   multivitamin with minerals  1 tablet Oral Daily   pantoprazole  40 mg Oral  Daily   simvastatin  40 mg Oral QHS   tamsulosin  0.4 mg Oral Daily   thiamine  100 mg Oral Daily   Continuous Infusions: PRN Meds:.acetaminophen, LORazepam   I have personally reviewed following labs and imaging studies  LABORATORY DATA: CBC: Recent Labs  Lab 07/24/21 2226 07/25/21 0017 07/25/21 0132 07/25/21 0204 07/25/21 0549  WBC 10.7*  --   --  11.7* 11.7*  NEUTROABS 7.2  --   --   --   --   HGB 13.0 14.6  14.3 12.2* 12.5* 12.5*  HCT 38.8* 43.0  42.0 36.0* 37.5* 37.6*  MCV 91.5  --   --  89.5 90.0  PLT 229  --   --  226 227     Basic Metabolic Panel: Recent Labs  Lab 07/24/21 2226 07/25/21 0017 07/25/21 0420 07/26/21 1346 07/27/21 0105 07/28/21 0028 07/30/21 0729  NA 132*   < > 134* 136 137 132* 133*  K 4.1   < > 4.1 3.8 3.4* 3.6 4.4  CL 98   < > 101 100 101 100 100  CO2 16*  --  22 26 27 23 23   GLUCOSE 561*   < > 507* 347* 168* 202* 285*  BUN 13   < > 13 9 7* 11 24*  CREATININE 1.97*   < > 1.62* 1.48* 1.40* 1.41* 1.53*  CALCIUM 9.4  --  9.1 9.2 9.2 8.8* 9.2  MG 2.0  --   --   --   --   --   --    < > = values in this interval not displayed.     GFR: Estimated Creatinine Clearance: 57.3 mL/min (A) (by C-G formula based on SCr of 1.53 mg/dL (H)).  Liver Function Tests: Recent Labs  Lab 07/24/21 2226  AST 27  ALT 22  ALKPHOS 100  BILITOT 0.6  PROT 7.1  ALBUMIN 3.4*    No results for input(s): LIPASE, AMYLASE in the last 168 hours. Recent Labs  Lab 07/24/21 2310  AMMONIA 60*     Coagulation Profile: Recent Labs  Lab 07/24/21 2226  INR 1.0     Cardiac Enzymes: No results for input(s): CKTOTAL, CKMB, CKMBINDEX, TROPONINI in the last 168 hours.  BNP (last 3 results) No results for input(s): PROBNP in the last 8760 hours.  Lipid Profile: No results for input(s): CHOL, HDL, LDLCALC, TRIG, CHOLHDL, LDLDIRECT in the last 72 hours.  Thyroid Function Tests: No results for input(s): TSH, T4TOTAL, FREET4, T3FREE, THYROIDAB in the  last 72 hours.  Anemia Panel: No results for input(s): VITAMINB12, FOLATE, FERRITIN, TIBC, IRON, RETICCTPCT in the last 72 hours.  Urine analysis:    Component Value Date/Time   COLORURINE STRAW (A) 07/25/2021 0026   APPEARANCEUR CLEAR 07/25/2021 0026   LABSPEC 1.021 07/25/2021 0026   PHURINE 5.0 07/25/2021 0026   GLUCOSEU >=500 (A) 07/25/2021 0026   HGBUR MODERATE (A) 07/25/2021 0026   BILIRUBINUR NEGATIVE 07/25/2021 0026   KETONESUR 5 (A) 07/25/2021 0026   PROTEINUR NEGATIVE 07/25/2021 0026   UROBILINOGEN 1.0 06/22/2011 1950   NITRITE NEGATIVE 07/25/2021 0026   LEUKOCYTESUR NEGATIVE 07/25/2021 0026    Sepsis Labs: Lactic  Acid, Venous    Component Value Date/Time   LATICACIDVEN 0.7 06/23/2011 0105    MICROBIOLOGY: Recent Results (from the past 240 hour(s))  Resp Panel by RT-PCR (Flu A&B, Covid) Nasopharyngeal Swab     Status: None   Collection Time: 07/24/21 10:26 PM   Specimen: Nasopharyngeal Swab; Nasopharyngeal(NP) swabs in vial transport medium  Result Value Ref Range Status   SARS Coronavirus 2 by RT PCR NEGATIVE NEGATIVE Final    Comment: (NOTE) SARS-CoV-2 target nucleic acids are NOT DETECTED.  The SARS-CoV-2 RNA is generally detectable in upper respiratory specimens during the acute phase of infection. The lowest concentration of SARS-CoV-2 viral copies this assay can detect is 138 copies/mL. A negative result does not preclude SARS-Cov-2 infection and should not be used as the sole basis for treatment or other patient management decisions. A negative result may occur with  improper specimen collection/handling, submission of specimen other than nasopharyngeal swab, presence of viral mutation(s) within the areas targeted by this assay, and inadequate number of viral copies(<138 copies/mL). A negative result must be combined with clinical observations, patient history, and epidemiological information. The expected result is Negative.  Fact Sheet for  Patients:  BloggerCourse.com  Fact Sheet for Healthcare Providers:  SeriousBroker.it  This test is no t yet approved or cleared by the [place] FDA and  has been authorized for detection and/or diagnosis of SARS-CoV-2 by FDA under an Emergency Use Authorization (EUA). This EUA will remain  in effect (meaning this test can be used) for the duration of the COVID-19 declaration under Section 564(b)(1) of the Act, 21 U.S.C.section 360bbb-3(b)(1), unless the authorization is terminated  or revoked sooner.       Influenza A by PCR NEGATIVE NEGATIVE Final   Influenza B by PCR NEGATIVE NEGATIVE Final    Comment: (NOTE) The Xpert Xpress SARS-CoV-2/FLU/RSV plus assay is intended as an aid in the diagnosis of influenza from Nasopharyngeal swab specimens and should not be used as a sole basis for treatment. Nasal washings and aspirates are unacceptable for Xpert Xpress SARS-CoV-2/FLU/RSV testing.  Fact Sheet for Patients: BloggerCourse.com  Fact Sheet for Healthcare Providers: SeriousBroker.it  This test is not yet approved or cleared by the [place] FDA and has been authorized for detection and/or diagnosis of SARS-CoV-2 by FDA under an Emergency Use Authorization (EUA). This EUA will remain in effect (meaning this test can be used) for the duration of the COVID-19 declaration under Section 564(b)(1) of the Act, 21 U.S.C. section 360bbb-3(b)(1), unless the authorization is terminated or revoked.  Performed at [facility], [address], [place], [place] [address]     RADIOLOGY STUDIES/RESULTS: No results found.   LOS: 6 days   Jeoffrey Massed, MD  [facility]    To contact the attending provider between 7A-7P or the covering provider during after hours 7P-7A, please log into the web site [email or web address] and access using universal [place] password for  that web site. If you do not have the password, please call the hospital operator.  07/31/2021, 10:22 AM

## 2021-08-01 LAB — GLUCOSE, CAPILLARY
Glucose-Capillary: 150 mg/dL — ABNORMAL HIGH (ref 70–99)
Glucose-Capillary: 194 mg/dL — ABNORMAL HIGH (ref 70–99)
Glucose-Capillary: 159 mg/dL — ABNORMAL HIGH (ref 70–99)
Glucose-Capillary: 303 mg/dL — ABNORMAL HIGH (ref 70–99)

## 2021-08-01 LAB — CREATININE, SERUM
Creatinine, Ser: 1.41 mg/dL — ABNORMAL HIGH (ref 0.61–1.24)
GFR, Estimated: 55 mL/min — ABNORMAL LOW (ref >60)

## 2021-08-01 MED ORDER — INSULIN ASPART PROT & ASPART (70-30 MIX) 100 UNIT/ML ~~LOC~~ SUSP
40.0000 [IU] | Freq: Every day | SUBCUTANEOUS | Status: DC
  Administered 2021-08-01 – 2021-08-02 (×2): 40 [IU] via SUBCUTANEOUS
  Filled 2021-08-01: qty 10

## 2021-08-01 MED ORDER — INSULIN ASPART PROT & ASPART (70-30 MIX) 100 UNIT/ML ~~LOC~~ SUSP
48.0000 [IU] | Freq: Every day | SUBCUTANEOUS | Status: DC
  Administered 2021-08-01 – 2021-08-02 (×2): 48 [IU] via SUBCUTANEOUS
  Filled 2021-08-01: qty 10

## 2021-08-01 NOTE — Progress Notes (Signed)
PROGRESS NOTE        PATIENT DETAILS Name: Wayne Wood Age: 65 y.o. Sex: male Date of Birth: Jan 15, 1956 Admit Date: 07/24/2021 Admitting Physician Joseph Art, DO FOY:DXAJO, Marlynn Perking., FNP  Brief Narrative: Patient is a 65 y.o. male with history of HTN, HLD, GERD, DM-2 presented with AMS and subsequent seizure-thought to have provoked seizures due to uncontrolled hyperglycemia.  Subjective: No major issues overnight-describes left ankle pain as mild and improved over the past few days.  Objective: Vitals: Blood pressure 131/81, pulse 69, temperature 97.9 F (36.6 C), temperature source Oral, resp. rate 18, height 5\' 10"  (1.778 m), weight 100.7 kg, SpO2 96 %.   Exam: Gen Exam:Alert awake-not in any distress HEENT:atraumatic, normocephalic Chest: B/L clear to auscultation anteriorly CVS:S1S2 regular Abdomen:soft non tender, non distended Extremities:no edema Neurology: Non focal Skin: no rash   Pertinent Labs/Radiology:   9/25>> MRI brain: No acute intracranial abnormality. 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   Assessment/Plan: Seizure: Provoked by hyperglycemia-no further seizures after correction of hypoglycemia-neurology recommends starting AED/EEG if seizure reoccurs.  Acute metabolic encephalopathy: Due to combination of hypoglycemia/postictal state-resolved-patient is completely awake and alert.  AKI: Likely hemodynamically mediated-improving  Uncontrolled DM-2 with hyperglycemia (A1c 11.7 on 9/25): CBGs fluctuating-increase a.m. insulin 70/30 to 48 units-continue insulin 70/30 40 units with supper.  Reassess on 10/3.  Recent Labs    07/31/21 1556 07/31/21 2104 08/01/21 0823  GLUCAP 307* 296* 150*     HLD: Continue statin  Left ankle pain: Claims to have fallen at home-ankle x-ray/CT scan negative for  fractures-apparently has a history of gout-no response to colchicine-better with IV steroids with significant hyperglycemia.  Orthopedics consulted-subsequently underwent intra-articular steroids injection on 9/30 with significant clinical improvement.  Now able to ambulate and bear weight.  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    GERD: Continue PPI  BPH: Continue Flomax  Debility/deconditioning: Due to acute illness-plans are for SNF  Obesity: Estimated body mass index is 31.85 kg/m as calculated from the following:   Height as of this encounter: 5\' 10"  (1.778 m).   Weight as of this encounter: 100.7 kg.    Procedures: None Consults: None DVT Prophylaxis: Lovenox Code Status:Full code  Family Communication: None at bedside  Time spent: 25 minutes-Greater than 50% of this time was spent in counseling, explanation of diagnosis, planning of further management, and coordination of care.  Diet: Diet Order             Diet heart healthy/carb modified Room service appropriate? Yes; Fluid consistency: Thin  Diet effective now                      Disposition Plan: Status is: Inpatient  Remains inpatient appropriate because:Inpatient level of care appropriate due to severity of illness  Dispo:  Patient From: Home  Planned Disposition: Skilled Nursing Facility   Medically stable for discharge: Yes      Barriers to Discharge: Awaiting SNF bed Antimicrobial agents: Anti-infectives (From admission, onward)    None        MEDICATIONS: Scheduled Meds:  colchicine  0.6 mg Oral Daily   enoxaparin (LOVENOX) injection  40 mg Subcutaneous Daily   folic acid  1 mg Oral Daily  insulin aspart  0-15 Units Subcutaneous TID WC   insulin aspart  0-5 Units Subcutaneous QHS   insulin aspart protamine- aspart  40 Units Subcutaneous Q supper   insulin aspart protamine- aspart  48 Units Subcutaneous Q breakfast   latanoprost  1 drop Both Eyes  q morning   lidocaine  1 patch Transdermal Q24H   multivitamin with minerals  1 tablet Oral Daily   pantoprazole  40 mg Oral Daily   simvastatin  40 mg Oral QHS   tamsulosin  0.4 mg Oral Daily   thiamine  100 mg Oral Daily   Continuous Infusions: PRN Meds:.acetaminophen, LORazepam   I have personally reviewed following labs and imaging studies  LABORATORY DATA: CBC: No results for input(s): WBC, NEUTROABS, HGB, HCT, MCV, PLT in the last 168 hours.   Basic Metabolic Panel: Recent Labs  Lab 07/26/21 1346 07/27/21 0105 07/28/21 0028 07/30/21 0729 08/01/21 0545  NA 136 137 132* 133*  --   K 3.8 3.4* 3.6 4.4  --   CL 100 101 100 100  --   CO2 26 27 23 23   --   GLUCOSE 347* 168* 202* 285*  --   BUN 9 7* 11 24*  --   CREATININE 1.48* 1.40* 1.41* 1.53* 1.41*  CALCIUM 9.2 9.2 8.8* 9.2  --      GFR: Estimated Creatinine Clearance: 62.1 mL/min (A) (by C-G formula based on SCr of 1.41 mg/dL (H)).  Liver Function Tests: No results for input(s): AST, ALT, ALKPHOS, BILITOT, PROT, ALBUMIN in the last 168 hours.  No results for input(s): LIPASE, AMYLASE in the last 168 hours. No results for input(s): AMMONIA in the last 168 hours.   Coagulation Profile: No results for input(s): INR, PROTIME in the last 168 hours.   Cardiac Enzymes: No results for input(s): CKTOTAL, CKMB, CKMBINDEX, TROPONINI in the last 168 hours.  BNP (last 3 results) No results for input(s): PROBNP in the last 8760 hours.  Lipid Profile: No results for input(s): CHOL, HDL, LDLCALC, TRIG, CHOLHDL, LDLDIRECT in the last 72 hours.  Thyroid Function Tests: No results for input(s): TSH, T4TOTAL, FREET4, T3FREE, THYROIDAB in the last 72 hours.  Anemia Panel: No results for input(s): VITAMINB12, FOLATE, FERRITIN, TIBC, IRON, RETICCTPCT in the last 72 hours.  Urine analysis:    Component Value Date/Time   COLORURINE STRAW (A) 07/25/2021 0026   APPEARANCEUR CLEAR 07/25/2021 0026   LABSPEC 1.021  07/25/2021 0026   PHURINE 5.0 07/25/2021 0026   GLUCOSEU >=500 (A) 07/25/2021 0026   HGBUR MODERATE (A) 07/25/2021 0026   BILIRUBINUR NEGATIVE 07/25/2021 0026   KETONESUR 5 (A) 07/25/2021 0026   PROTEINUR NEGATIVE 07/25/2021 0026   UROBILINOGEN 1.0 06/22/2011 1950   NITRITE NEGATIVE 07/25/2021 0026   LEUKOCYTESUR NEGATIVE 07/25/2021 0026    Sepsis Labs: Lactic Acid, Venous    Component Value Date/Time   LATICACIDVEN 0.7 06/23/2011 0105    MICROBIOLOGY: Recent Results (from the past 240 hour(s))  Resp Panel by RT-PCR (Flu A&B, Covid) Nasopharyngeal Swab     Status: None   Collection Time: 07/24/21 10:26 PM   Specimen: Nasopharyngeal Swab; Nasopharyngeal(NP) swabs in vial transport medium  Result Value Ref Range Status   SARS Coronavirus 2 by RT PCR NEGATIVE NEGATIVE Final    Comment: (NOTE) SARS-CoV-2 target nucleic acids are NOT DETECTED.  The SARS-CoV-2 RNA is generally detectable in upper respiratory specimens during the acute phase of infection. The lowest concentration of SARS-CoV-2 viral copies this assay can detect is  138 copies/mL. A negative result does not preclude SARS-Cov-2 infection and should not be used as the sole basis for treatment or other patient management decisions. A negative result may occur with  improper specimen collection/handling, submission of specimen other than nasopharyngeal swab, presence of viral mutation(s) within the areas targeted by this assay, and inadequate number of viral copies(<138 copies/mL). A negative result must be combined with clinical observations, patient history, and epidemiological information. The expected result is Negative.  Fact Sheet for Patients:  BloggerCourse.com  Fact Sheet for Healthcare Providers:  SeriousBroker.it  This test is no t yet approved or cleared by the [place] FDA and  has been authorized for detection and/or diagnosis of SARS-CoV-2  by FDA under an Emergency Use Authorization (EUA). This EUA will remain  in effect (meaning this test can be used) for the duration of the COVID-19 declaration under Section 564(b)(1) of the Act, 21 U.S.C.section 360bbb-3(b)(1), unless the authorization is terminated  or revoked sooner.       Influenza A by PCR NEGATIVE NEGATIVE Final   Influenza B by PCR NEGATIVE NEGATIVE Final    Comment: (NOTE) The Xpert Xpress SARS-CoV-2/FLU/RSV plus assay is intended as an aid in the diagnosis of influenza from Nasopharyngeal swab specimens and should not be used as a sole basis for treatment. Nasal washings and aspirates are unacceptable for Xpert Xpress SARS-CoV-2/FLU/RSV testing.  Fact Sheet for Patients: BloggerCourse.com  Fact Sheet for Healthcare Providers: SeriousBroker.it  This test is not yet approved or cleared by the [place] FDA and has been authorized for detection and/or diagnosis of SARS-CoV-2 by FDA under an Emergency Use Authorization (EUA). This EUA will remain in effect (meaning this test can be used) for the duration of the COVID-19 declaration under Section 564(b)(1) of the Act, 21 U.S.C. section 360bbb-3(b)(1), unless the authorization is terminated or revoked.  Performed at [facility], [address] [place], [place] [address]     RADIOLOGY STUDIES/RESULTS: No results found.   LOS: 7 days   Jeoffrey Massed, MD  [facility]    To contact the attending provider between 7A-7P or the covering provider during after hours 7P-7A, please log into the web site [email or web address] and access using universal [place] password for that web site. If you do not have the password, please call the hospital operator.  08/01/2021, 10:38 AM

## 2021-08-01 NOTE — Progress Notes (Signed)
Subjective:    Patient reports pain as mild.  Left ankle pain has significantly improved following left ankle injection.    Objective: Vital signs in last 24 hours: Temp:  [97.9 F (36.6 C)-98.2 F (36.8 C)] 97.9 F (36.6 C) (10/02 0820) Pulse Rate:  [69-85] 69 (10/02 0820) Resp:  [13-20] 18 (10/02 0820) BP: (130-152)/(68-88) 131/81 (10/02 0820) SpO2:  [95 %-98 %] 96 % (10/02 0820)  Intake/Output from previous day: 10/01 0701 - 10/02 0700 In: 470 [P.O.:470] Out: 1650 [Urine:1650] Intake/Output this shift: Total I/O In: 150 [P.O.:150] Out: 300 [Urine:300]  No results for input(s): HGB in the last 72 hours. No results for input(s): WBC, RBC, HCT, PLT in the last 72 hours. Recent Labs    07/30/21 0729 08/01/21 0545  NA 133*  --   K 4.4  --   CL 100  --   CO2 23  --   BUN 24*  --   CREATININE 1.53* 1.41*  GLUCOSE 285*  --   CALCIUM 9.2  --    No results for input(s): LABPT, INR in the last 72 hours.  Neurologically intact Neurovascular intact Sensation intact distally Intact pulses distally Dorsiflexion/Plantar flexion intact No cellulitis present Compartment soft   Assessment/Plan: Left ankle pain likely from gout given great response to cortisone injection WBAT LLE Ortho signing off.  F/u outpatient as needed       Wayne Wood 08/01/2021, 9:24 AM

## 2021-08-02 ENCOUNTER — Other Ambulatory Visit (HOSPITAL_COMMUNITY): Payer: Self-pay

## 2021-08-02 DIAGNOSIS — E119 Type 2 diabetes mellitus without complications: Secondary | ICD-10-CM

## 2021-08-02 LAB — GLUCOSE, CAPILLARY
Glucose-Capillary: 165 mg/dL — ABNORMAL HIGH (ref 70–99)
Glucose-Capillary: 181 mg/dL — ABNORMAL HIGH (ref 70–99)
Glucose-Capillary: 213 mg/dL — ABNORMAL HIGH (ref 70–99)

## 2021-08-02 MED ORDER — INSULIN ASPART PROT & ASPART (70-30 MIX) 100 UNIT/ML PEN
48.0000 [IU] | PEN_INJECTOR | Freq: Every day | SUBCUTANEOUS | 11 refills | Status: DC
  Filled 2021-08-02: qty 21, 24d supply, fill #0

## 2021-08-02 MED ORDER — COLCHICINE 0.6 MG PO CAPS
0.6000 mg | ORAL_CAPSULE | Freq: Every day | ORAL | 0 refills | Status: AC
  Filled 2021-08-02: qty 30, 30d supply, fill #0

## 2021-08-02 MED ORDER — INSULIN ASPART PROT & ASPART (70-30 MIX) 100 UNIT/ML ~~LOC~~ SUSP
40.0000 [IU] | Freq: Every day | SUBCUTANEOUS | 11 refills | Status: DC
  Filled 2021-08-02: qty 10, 25d supply, fill #0

## 2021-08-02 MED ORDER — GLUCOSE BLOOD VI STRP
ORAL_STRIP | 0 refills | Status: AC
  Filled 2021-08-02: qty 100, 30d supply, fill #0

## 2021-08-02 MED ORDER — ACCU-CHEK SOFTCLIX LANCETS MISC
0 refills | Status: AC
  Filled 2021-08-02: qty 100, 30d supply, fill #0

## 2021-08-02 MED ORDER — BLOOD GLUCOSE MONITOR SYSTEM W/DEVICE KIT
PACK | 0 refills | Status: AC
  Filled 2021-08-02: qty 1, 30d supply, fill #0

## 2021-08-02 MED ORDER — PEN NEEDLES 32G X 4 MM MISC
0 refills | Status: DC
  Filled 2021-08-02: qty 100, 30d supply, fill #0

## 2021-08-02 NOTE — Discharge Instructions (Signed)
Seizure precautions: °Per [place] DMV statutes, patients with seizures are not allowed to drive until they have been seizure-free for six months and cleared by a physician  °  °Use caution when using heavy equipment or power tools. Avoid working on ladders or at heights. Take showers instead of baths. Ensure the water temperature is not too high on the home water heater. Do not go swimming alone. Do not lock yourself in a room alone (i.e. bathroom). When caring for infants or small children, sit down when holding, feeding, or changing them to minimize risk of injury to the child in the event you have a seizure. Maintain good sleep hygiene. Avoid alcohol.  °  °If patient has another seizure, call 911 and bring them back to the ED if: °A.  The seizure lasts longer than 5 minutes.      °B.  The patient doesn't wake shortly after the seizure or has new problems such as difficulty seeing, speaking or moving following the seizure °C.  The patient was injured during the seizure °D.  The patient has a temperature over 102 F (39C) °E.  The patient vomited during the seizure and now is having trouble breathing °   °During the Seizure °  °- First, ensure adequate ventilation and place patients on the floor on their left side  °Loosen clothing around the neck and ensure the airway is patent. If the patient is clenching the teeth, do not force the mouth open with any object as this can cause severe damage °- Remove all items from the surrounding that can be hazardous. The patient may be oblivious to what's happening and may not even know what he or she is doing. °If the patient is confused and wandering, either gently guide him/her away and block access to outside areas °- Reassure the individual and be comforting °- Call 911. In most cases, the seizure ends before EMS arrives. However, there are cases when seizures may last over 3 to 5 minutes. Or the individual may have developed breathing difficulties or severe  injuries. If a pregnant patient or a person with diabetes develops a seizure, it is prudent to call an ambulance. °- Finally, if the patient does not regain full consciousness, then call EMS. Most patients will remain confused for about 45 to 90 minutes after a seizure, so you must use judgment in calling for help. °- Avoid restraints but make sure the patient is in a bed with padded side rails °- Place the individual in a lateral position with the neck slightly flexed; this will help the saliva drain from the mouth and prevent the tongue from falling backward °- Remove all nearby furniture and other hazards from the area °- Provide verbal assurance as the individual is regaining consciousness °- Provide the patient with privacy if possible °- Call for help and start treatment as ordered by the caregiver °  ° After the Seizure (Postictal Stage) °  °After a seizure, most patients experience confusion, fatigue, muscle pain and/or a headache. Thus, one should permit the individual to sleep. For the next few days, reassurance is essential. Being calm and helping reorient the person is also of importance. °  °Most seizures are painless and end spontaneously. Seizures are not harmful to others but can lead to complications such as stress on the lungs, brain and the heart. Individuals with prior lung problems may develop labored breathing and respiratory distress.  °  °

## 2021-08-02 NOTE — TOC Transition Note (Signed)
Transition of Care [facility]) - CM/SW Discharge Note   Patient Details  Name: Wayne Wood MRN: [id number] Date of Birth: Mar 12, 1956  [facility] CM/SW Contact:  Durenda Guthrie, RN Phone Number: 08/02/2021, 12:17 PM   Clinical Narrative:  Patient has declined going to SNF., wants to go home. CM spoke with patient concerning HHRN to assist with medications and education. Referral called to [facility] Liaison. Patient asked CM if we could get him some clothes to wear home, unfortunately our closet is empty, patient will be provided paper scrubs at discharge.[employer] will deliver RW to room.     Final next level of care: Home w Home Health Services Barriers to Discharge: No Barriers Identified   Patient Goals and CMS Choice Patient states their goals for this hospitalization and ongoing recovery are:: go home CMS Medicare.gov Compare Post Acute Care list provided to:: Patient Choice offered to / list presented to : Patient  Discharge Placement                       Discharge Plan and Services In-house Referral: NA Discharge Planning Services: CM Consult Post Acute Care Choice: Home Health          DME Arranged: Walker rolling DME Agency: [employer] Date DME Agency Contacted: 08/02/21 Time DME Agency Contacted: 1200 Representative spoke with at DME Agency: Velna Hatchet HH Arranged: RN, Disease Management HH Agency: [facility] Date [facility] Agency Contacted: 08/02/21 Time HH Agency Contacted: [phone]    Social Determinants of Health (SDOH) Interventions     Readmission Risk Interventions No flowsheet data found.

## 2021-08-02 NOTE — Discharge Summary (Addendum)
PATIENT DETAILS Name: Wayne Wood Age: 65 y.o. Sex: male Date of Birth: 02-Mar-1956 MRN: [id number]. Admitting Physician: Geradine Girt, DO GOT:LXBWI, Malva Limes., FNP  Admit Date: 07/24/2021 Discharge date: 08/02/2021  Recommendations for Outpatient Follow-up:  Follow up with PCP in 1-2 weeks Please obtain CMP/CBC in one week Please continue to optimize diabetic regimen.  Admitted From:  Home  Disposition: Home with home health services   [place]: Yes  Equipment/Devices: none  Discharge Condition: Stable  CODE STATUS: FULL CODE  Diet recommendation:  Diet Order             Diet - low sodium heart healthy           Diet general           Diet heart healthy/carb modified Room service appropriate? Yes; Fluid consistency: Thin  Diet effective now                    Brief Summary: Patient is a 65 y.o. male with history of HTN, HLD, GERD, DM-2 presented with AMS and subsequent seizure-thought to have provoked seizures due to uncontrolled hyperglycemia.    Brief Hospital Course: Seizure: Provoked by hyperglycemia-no further seizures after correction of hypoglycemia-neurology recommends starting AED/EEG if seizure reoccurs.  Patient aware of driving restrictions/seizure precautions-until cleared by outpatient neurology.  Referral to [facility] neurology sent via epic.   Acute metabolic encephalopathy: Due to combination of hypoglycemia/postictal state-resolved-patient is completely awake and alert.   AKI: Likely hemodynamically mediated-has improved-creatinine has plateaued to around 1.4-suspect he may have some amount of CKD at baseline.  PCP to follow and trend accordingly.  HTN: BP on the higher side-resume lisinopril on discharge.  PCP to optimize-follow electrolytes accordingly.   Uncontrolled DM-2 with hyperglycemia (A1c 11.7 on 9/25): CBGs better controlled-continue insulin 70/30 48 units in a.m. and insulin 70/30 40 units in p.m.  On his usual  oral hyperal glycemic regimen on discharge as well.  RN will provide insulin administration education-diabetic education prior to discharge.  Per patient-he will have someone at home who will help him with insulin injections.  PCP to assess and adjust accordingly.    HLD: Continue statin   Left ankle pain: Claims to have fallen at home-ankle x-ray/CT scan negative for fractures-apparently has a history of gout-no response to colchicine-better with IV steroids with significant hyperglycemia.  Orthopedics consulted-subsequently underwent intra-articular steroids injection on 9/30 with significant clinical improvement.  Now able to ambulate and bear weight.  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     GERD: Continue PPI   BPH: Continue Flomax   Debility/deconditioning: Due to acute illness-plans were for SNF-however patient feels much better-his left ankle pain has improved significantly-and he desires that he be discharged home today.  He will be discharged with maximal home health services.   Obesity: Estimated body mass index is 31.85 kg/m as calculated from the following:   Height as of this encounter: 5' 10" (1.778 m).   Weight as of this encounter: 100.7 kg.    Procedures None  Discharge Diagnoses:  Active Problems:   Essential hypertension   Seizure ([name])   DM (diabetes mellitus), type 2, uncontrolled   Discharge Instructions:  Activity:  As tolerated   Discharge Instructions     Ambulatory referral to Neurology   Complete by: As directed    An appointment is requested in approximately: 4 weeks   Call MD for:  extreme  fatigue   Complete by: As directed    Call MD for:  persistant dizziness or light-headedness   Complete by: As directed    Diet - low sodium heart healthy   Complete by: As directed    Diet general   Complete by: As directed    Discharge instructions   Complete by: As directed    Seizure  precautions: Per [facility] statutes, patients with seizures are not allowed to drive until they have been seizure-free for six months and cleared by a physician    Use caution when using heavy equipment or power tools. Avoid working on ladders or at heights. Take showers instead of baths. Ensure the water temperature is not too high on the home water heater. Do not go swimming alone. Do not lock yourself in a room alone (i.e. bathroom). When caring for infants or small children, sit down when holding, feeding, or changing them to minimize risk of injury to the child in the event you have a seizure. Maintain good sleep hygiene. Avoid alcohol.    If patient has another seizure, call 911 and bring them back to the ED if: A.  The seizure lasts longer than 5 minutes.      B.  The patient doesn't wake shortly after the seizure or has new problems such as difficulty seeing, speaking or moving following the seizure C.  The patient was injured during the seizure D.  The patient has a temperature over 102 F (39C) E.  The patient vomited during the seizure and now is having trouble breathing    During the Seizure   - First, ensure adequate ventilation and place patients on the floor on their left side  Loosen clothing around the neck and ensure the airway is patent. If the patient is clenching the teeth, do not force the mouth open with any object as this can cause severe damage - Remove all items from the surrounding that can be hazardous. The patient may be oblivious to what's happening and may not even know what he or she is doing. If the patient is confused and wandering, either gently guide him/her away and block access to outside areas - Reassure the individual and be comforting - Call 911. In most cases, the seizure ends before EMS arrives. However, there are cases when seizures may last over 3 to 5 minutes. Or the individual may have developed breathing difficulties or severe injuries. If a  pregnant patient or a person with diabetes develops a seizure, it is prudent to call an ambulance. - Finally, if the patient does not regain full consciousness, then call EMS. Most patients will remain confused for about 45 to 90 minutes after a seizure, so you must use judgment in calling for help. - Avoid restraints but make sure the patient is in a bed with padded side rails - Place the individual in a lateral position with the neck slightly flexed; this will help the saliva drain from the mouth and prevent the tongue from falling backward - Remove all nearby furniture and other hazards from the area - Provide verbal assurance as the individual is regaining consciousness - Provide the patient with privacy if possible - Call for help and start treatment as ordered by the caregiver    After the Seizure (Postictal Stage)   After a seizure, most patients experience confusion, fatigue, muscle pain and/or a headache. Thus, one should permit the individual to sleep. For the next few days, reassurance is essential.  Being calm and helping reorient the person is also of importance.   Most seizures are painless and end spontaneously. Seizures are not harmful to others but can lead to complications such as stress on the lungs, brain and the heart. Individuals with prior lung problems may develop labored breathing and respiratory distress.   Follow with Primary MD  Sonia Side., FNP in 1-2 weeks  Please keep a record of your CBGs/blood glucose readings-take it with you to your next appointment with a primary care practitioner  Please get a complete blood count and chemistry panel checked by your Primary MD at your next visit, and again as instructed by your Primary MD.  Get Medicines reviewed and adjusted: Please take all your medications with you for your next visit with your Primary MD  Laboratory/radiological data: Please request your Primary MD to go over all hospital tests and  procedure/radiological results at the follow up, please ask your Primary MD to get all Hospital records sent to his/her office.  In some cases, they will be blood work, cultures and biopsy results pending at the time of your discharge. Please request that your primary care M.D. follows up on these results.  Also Note the following: If you experience worsening of your admission symptoms, develop shortness of breath, life threatening emergency, suicidal or homicidal thoughts you must seek medical attention immediately by calling 911 or calling your MD immediately  if symptoms less severe.  You must read complete instructions/literature along with all the possible adverse reactions/side effects for all the Medicines you take and that have been prescribed to you. Take any new Medicines after you have completely understood and accpet all the possible adverse reactions/side effects.   Do not drive when taking Pain medications or sleeping medications (Benzodaizepines)  Do not take more than prescribed Pain, Sleep and Anxiety Medications. It is not advisable to combine anxiety,sleep and pain medications without talking with your primary care practitioner  Special Instructions: If you have smoked or chewed Tobacco  in the last 2 yrs please stop smoking, stop any regular Alcohol  and or any Recreational drug use.  Wear Seat belts while driving.  Please note: You were cared for by a hospitalist during your hospital stay. Once you are discharged, your primary care physician will handle any further medical issues. Please note that NO REFILLS for any discharge medications will be authorized once you are discharged, as it is imperative that you return to your primary care physician (or establish a relationship with a primary care physician if you do not have one) for your post hospital discharge needs so that they can reassess your need for medications and monitor your lab values.   Increase activity slowly    Complete by: As directed       Allergies as of 08/02/2021   No Known Allergies      Medication List     STOP taking these medications    colchicine 0.6 MG tablet Replaced by: Mitigare 0.6 MG Caps       TAKE these medications    Accu-Chek Guide test strip Generic drug: glucose blood use as directed   Accu-Chek Guide w/Device Kit Use up to 4 times daily as directed   Accu-Chek Softclix Lancets lancets use as directed   albuterol 108 (90 Base) MCG/ACT inhaler Commonly known as: VENTOLIN HFA Inhale 1-2 puffs into the lungs every 4 (four) hours as needed for wheezing or shortness of breath.   aspirin EC 81 MG  tablet Take 81 mg by mouth daily. Swallow whole.   glipiZIDE 10 MG tablet Commonly known as: GLUCOTROL Take 10 mg by mouth 2 (two) times daily.   latanoprost 0.005 % ophthalmic solution Commonly known as: XALATAN Place 1 drop into both eyes every morning.   lisinopril 20 MG tablet Commonly known as: ZESTRIL Take 20 mg by mouth daily.   metFORMIN 1000 MG tablet Commonly known as: GLUCOPHAGE Take 1,000 mg by mouth 2 (two) times daily.   Mitigare 0.6 MG Caps Generic drug: Colchicine Take 1 capsule (0.6 mg) by mouth daily. Replaces: colchicine 0.6 MG tablet   multivitamin tablet Take 1 tablet by mouth daily.   NovoLOG Mix 70/30 FlexPen (70-30) 100 UNIT/ML FlexPen Generic drug: insulin aspart protamine - aspart Inject 48 Units into the skin daily with breakfast and 40 units with supper. Start taking on: August 03, 2021   omeprazole 20 MG capsule Commonly known as: PRILOSEC Take 20 mg by mouth daily.   Pentips 32G X 4 MM Misc Generic drug: Insulin Pen Needle use as directed   simvastatin 40 MG tablet Commonly known as: ZOCOR Take 40 mg by mouth at bedtime.   tamsulosin 0.4 MG Caps capsule Commonly known as: FLOMAX Take 0.4 mg by mouth daily.               Durable Medical Equipment  (From admission, onward)           Start      Ordered   08/02/21 1157  For home use only DME Walker rolling  Once       Question Answer Comment  Walker: With 5 Inch Wheels   Patient needs a walker to treat with the following condition Physical deconditioning      08/02/21 1156            Follow-up Information     Sonia Side., FNP. Schedule an appointment as soon as possible for a visit in 1 week(s).   Specialty: Family Medicine Contact information: [place] [address] [phone]         [facility] Follow up.   Why: Hospital follow up, Office will call with date/time, If you dont hear from them,please give them a call Contact information: [place] [address] [place] Follow up.   Specialty: Home Health Services Why: Someone from [facility] will contact you to arrange start date and time for your Nurse visit. 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 No acute fracture or dislocation. IMPRESSION: Mild left ankle and hindfoot osteoarthrosis. Electronically Signed   By: Ulyses Jarred M.D.   On: 07/27/2021 00:15   CT HEAD WO CONTRAST  Result Date: 07/24/2021 CLINICAL DATA:  Altered mental status, incontinence, seizure EXAM: CT HEAD WITHOUT CONTRAST TECHNIQUE: Contiguous axial images were obtained from the base of the skull through the vertex without intravenous contrast. COMPARISON:  None. FINDINGS: Brain: Normal anatomic configuration of the brain. Age-indeterminate lacunar infarct within the posterior limb of the left internal  capsule. No acute intracranial  hemorrhage. No abnormal mass effect or midline shift. No abnormal intra or extra-axial mass lesion. Ventricular size is normal. Cerebellum is unremarkable. Vascular: No asymmetric hyperdense vasculature at the skull base. Skull: Intact Sinuses/Orbits: Bilateral maxillary antrectomy, turbinectomy, and partial ethmoidectomy has been performed. There is moderate residual mucosal thickening within the maxillary antra as well as within the residual ethmoid air cells. No air-fluid levels. Orbits are unremarkable. Other: Mastoid air cells and middle ear cavities are clear. IMPRESSION: Age-indeterminate lacunar infarct within the posterior limb of the left internal capsule. If indicated, this could be better aged utilizing MRI examination. No acute intracranial hemorrhage. Postsurgical changes within the paranasal sinuses, as outlined above, with residual mild paranasal sinus disease. Electronically Signed   By: Fidela Salisbury M.D.   On: 07/24/2021 23:00   MR BRAIN WO CONTRAST  Result Date: 07/25/2021 CLINICAL DATA:  65 year old male with unexplained altered mental status. Age indeterminate left internal capsule lacunar infarct on head CT yesterday. EXAM: MRI HEAD WITHOUT CONTRAST TECHNIQUE: Multiplanar, multiecho pulse sequences of the brain and surrounding structures were obtained without intravenous contrast. COMPARISON:  Head CT 07/24/2021. FINDINGS: Brain: No restricted diffusion to suggest acute infarction. No midline shift, mass effect, evidence of mass lesion, ventriculomegaly, extra-axial collection or acute intracranial hemorrhage. Cervicomedullary junction and pituitary are within normal limits. Small chronic lacunar infarct of the medial left thalamus. Additional mild T2 and FLAIR heterogeneity in the basal ganglia/internal capsule region more resemble perivascular spaces than chronic ischemia. Elsewhere [place] Cables and white matter signal is within normal limits for age throughout the brain. No cortical  encephalomalacia. No chronic cerebral blood products. Cerebral volume is within normal limits for age. Vascular: Major intracranial vascular flow voids are preserved. Skull and upper cervical spine: Mild cervical disc degeneration. Visualized bone marrow signal is within normal limits. Sinuses/Orbits: Negative orbits. Bilateral paranasal sinus mucoperiosteal thickening is stable. Other: Mastoids are clear. Grossly normal visible internal auditory structures. Negative visible scalp and face. IMPRESSION: 1. No acute intracranial abnormality. 2. 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 Otherwise normal for age noncontrast MRI appearance of the brain. 3. Chronic paranasal sinus disease. Electronically Signed   By: Genevie Ann M.D.   On: 07/25/2021 04:05   CT ANKLE LEFT WO CONTRAST  Result Date: 07/28/2021 CLINICAL DATA:  Severe ankle pain status post fall. EXAM: CT OF THE LEFT ANKLE WITHOUT CONTRAST TECHNIQUE: Multidetector CT imaging of the left ankle was performed according to the standard protocol. Multiplanar CT image reconstructions were also generated. COMPARISON:  None. FINDINGS: Bones/Joint/Cartilage No fracture or dislocation. Normal alignment. No joint effusion. Mild osteoarthritis of the tibiotalar joint. Osteochondral lesions involving the medial and lateral corner of the talar dome. 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 Mild osteoarthritis of the subtalar joints. Moderate osteoarthritis of the talonavicular joint. Small plantar calcaneal spur. Ligaments Ligaments are suboptimally evaluated by CT. Muscles and Tendons Muscles are normal. No muscle atrophy. No intramuscular fluid collection or hematoma. Flexor, extensor, peroneal and Achilles tendons are intact. Soft tissue No fluid collection or hematoma. No soft tissue mass. Small tissue wounds involving the medial aspect of the ankle with edema in the subcutaneous fat concerning for cellulitis. IMPRESSION: 1.  No acute osseous injury of  the left ankle. 2. Mild osteoarthritis of the tibiotalar joint. 3. Osteochondral lesions involving the medial and lateral corner of the talar dome. 4. Mild osteoarthritis of the subtalar joints. Moderate osteoarthritis of the talonavicular joint. 5. Small tissue wounds involving the  medial aspect of the ankle with edema in the subcutaneous fat concerning for cellulitis. Electronically Signed   By: Kathreen Devoid M.D.   On: 07/28/2021 17:54   DG CHEST PORT 1 VIEW  Result Date: 07/27/2021 CLINICAL DATA:  Fever EXAM: PORTABLE CHEST 1 VIEW COMPARISON:  07/24/2021 FINDINGS: Prior median sternotomy. Heart and mediastinal contours are within normal limits. No focal opacities or effusions. No acute bony abnormality. IMPRESSION: No active disease. Electronically Signed   By: Rolm Baptise M.D.   On: 07/27/2021 09:07   DG Chest Port 1 View  Result Date: 07/24/2021 CLINICAL DATA:  Altered mental status. EXAM: PORTABLE CHEST 1 VIEW COMPARISON:  Chest x-ray 06/22/2011. FINDINGS: Markedly limited evaluation of the cardiomediastinal silhouette due to patient rotation. No focal consolidation. No pulmonary edema. No pleural effusion. No pneumothorax. No acute osseous abnormality. IMPRESSION: Markedly limited evaluation of the cardiomediastinal silhouette due to patient rotation. Recommend repeat frontal view of the chest. Electronically Signed   By: Iven Finn M.D.   On: 07/24/2021 23:52     TODAY-DAY OF DISCHARGE:  Subjective:   [place] Keys today has no headache,no chest abdominal pain,no new weakness tingling or numbness, feels much better wants to go home today.   Objective:   Blood pressure (!) 150/93, pulse 83, temperature 97.7 F (36.5 C), temperature source Oral, resp. rate 20, height 5' 10" (1.778 m), weight 100.7 kg, SpO2 97 %.  Intake/Output Summary (Last 24 hours) at 08/02/2021 1305 Last data filed at 08/02/2021 0825 Gross per 24 hour  Intake 670 ml  Output 1625 ml  Net -955 ml   Filed  Weights   07/27/21 0312 07/31/21 0400  Weight: 103.4 kg 100.7 kg    Exam: Awake Alert, Oriented *3, No new F.N deficits, Normal affect [place].AT,PERRAL Supple Neck,No JVD, No cervical lymphadenopathy appriciated.  Symmetrical Chest wall movement, Good air movement bilaterally, CTAB RRR,No Gallops,Rubs or new Murmurs, No Parasternal Heave +ve B.Sounds, Abd Soft, Non tender, No organomegaly appriciated, No rebound -guarding or rigidity. No Cyanosis, Clubbing or edema, No new Rash or bruise   PERTINENT RADIOLOGIC STUDIES: No results found.   PERTINENT LAB RESULTS: CBC: No results for input(s): WBC, HGB, HCT, PLT in the last 72 hours. CMET CMP     Component Value Date/Time   NA 133 (L) 07/30/2021 0729   K 4.4 07/30/2021 0729   CL 100 07/30/2021 0729   CO2 23 07/30/2021 0729   GLUCOSE 285 (H) 07/30/2021 0729   BUN 24 (H) 07/30/2021 0729   CREATININE 1.41 (H) 08/01/2021 0545   CALCIUM 9.2 07/30/2021 0729   PROT 7.1 07/24/2021 2226   ALBUMIN 3.4 (L) 07/24/2021 2226   AST 27 07/24/2021 2226   ALT 22 07/24/2021 2226   ALKPHOS 100 07/24/2021 2226   BILITOT 0.6 07/24/2021 2226   GFRNONAA 55 (L) 08/01/2021 0545   GFRAA >60 06/27/2011 0501    GFR Estimated Creatinine Clearance: 62.1 mL/min (A) (by C-G formula based on SCr of 1.41 mg/dL (H)). No results for input(s): LIPASE, AMYLASE in the last 72 hours. No results for input(s): CKTOTAL, CKMB, CKMBINDEX, TROPONINI in the last 72 hours. Invalid input(s): POCBNP No results for input(s): DDIMER in the last 72 hours. No results for input(s): HGBA1C in the last 72 hours. No results for input(s): CHOL, HDL, LDLCALC, TRIG, CHOLHDL, LDLDIRECT in the last 72 hours. No results for input(s): TSH, T4TOTAL, T3FREE, THYROIDAB in the last 72 hours.  Invalid input(s): FREET3 No results for input(s): VITAMINB12, FOLATE, FERRITIN, TIBC,  IRON, RETICCTPCT in the last 72 hours. Coags: No results for input(s): INR in the last 72 hours.  Invalid  input(s): PT Microbiology: Recent Results (from the past 240 hour(s))  Resp Panel by RT-PCR (Flu A&B, Covid) Nasopharyngeal Swab     Status: None   Collection Time: 07/24/21 10:26 PM   Specimen: Nasopharyngeal Swab; Nasopharyngeal(NP) swabs in vial transport medium  Result Value Ref Range Status   SARS Coronavirus 2 by RT PCR NEGATIVE NEGATIVE Final    Comment: (NOTE) SARS-CoV-2 target nucleic acids are NOT DETECTED.  The SARS-CoV-2 RNA is generally detectable in upper respiratory specimens during the acute phase of infection. The lowest concentration of SARS-CoV-2 viral copies this assay can detect is 138 copies/mL. A negative result does not preclude SARS-Cov-2 infection and should not be used as the sole basis for treatment or other patient management decisions. A negative result may occur with  improper specimen collection/handling, submission of specimen other than nasopharyngeal swab, presence of viral mutation(s) within the areas targeted by this assay, and inadequate number of viral copies(<138 copies/mL). A negative result must be combined with clinical observations, patient history, and epidemiological information. The expected result is Negative.  Fact Sheet for Patients:  EntrepreneurPulse.com.au  Fact Sheet for Healthcare Providers:  IncredibleEmployment.be  This test is no t yet approved or cleared by the [place] FDA and  has been authorized for detection and/or diagnosis of SARS-CoV-2 by FDA under an Emergency Use Authorization (EUA). This EUA will remain  in effect (meaning this test can be used) for the duration of the COVID-19 declaration under Section 564(b)(1) of the Act, 21 U.S.C.section 360bbb-3(b)(1), unless the authorization is terminated  or revoked sooner.       Influenza A by PCR NEGATIVE NEGATIVE Final   Influenza B by PCR NEGATIVE NEGATIVE Final    Comment: (NOTE) The Xpert Xpress SARS-CoV-2/FLU/RSV plus  assay is intended as an aid in the diagnosis of influenza from Nasopharyngeal swab specimens and should not be used as a sole basis for treatment. Nasal washings and aspirates are unacceptable for Xpert Xpress SARS-CoV-2/FLU/RSV testing.  Fact Sheet for Patients: EntrepreneurPulse.com.au  Fact Sheet for Healthcare Providers: IncredibleEmployment.be  This test is not yet approved or cleared by the [place] FDA and has been authorized for detection and/or diagnosis of SARS-CoV-2 by FDA under an Emergency Use Authorization (EUA). This EUA will remain in effect (meaning this test can be used) for the duration of the COVID-19 declaration under Section 564(b)(1) of the Act, 21 U.S.C. section 360bbb-3(b)(1), unless the authorization is terminated or revoked.  Performed at [facility], [place] [address], [place], [place] [address]     FURTHER DISCHARGE INSTRUCTIONS:  Get Medicines reviewed and adjusted: Please take all your medications with you for your next visit with your Primary MD  Laboratory/radiological data: Please request your Primary MD to go over all hospital tests and procedure/radiological results at the follow up, please ask your Primary MD to get all Hospital records sent to his/her office.  In some cases, they will be blood work, cultures and biopsy results pending at the time of your discharge. Please request that your primary care M.D. goes through all the records of your hospital data and follows up on these results.  Also Note the following: If you experience worsening of your admission symptoms, develop shortness of breath, life threatening emergency, suicidal or homicidal thoughts you must seek medical attention immediately by calling 911 or calling your MD immediately  if  symptoms less severe.  You must read complete instructions/literature along with all the possible adverse reactions/side effects for all the Medicines  you take and that have been prescribed to you. Take any new Medicines after you have completely understood and accpet all the possible adverse reactions/side effects.   Do not drive when taking Pain medications or sleeping medications (Benzodaizepines)  Do not take more than prescribed Pain, Sleep and Anxiety Medications. It is not advisable to combine anxiety,sleep and pain medications without talking with your primary care practitioner  Special Instructions: If you have smoked or chewed Tobacco  in the last 2 yrs please stop smoking, stop any regular Alcohol  and or any Recreational drug use.  Wear Seat belts while driving.  Please note: You were cared for by a hospitalist during your hospital stay. Once you are discharged, your primary care physician will handle any further medical issues. Please note that NO REFILLS for any discharge medications will be authorized once you are discharged, as it is imperative that you return to your primary care physician (or establish a relationship with a primary care physician if you do not have one) for your post hospital discharge needs so that they can reassess your need for medications and monitor your lab values.  Total Time spent coordinating discharge including counseling, education and face to face time equals 35 minutes.  Signed: Shanker Ghimire 08/02/2021 1:05 PM

## 2021-08-02 NOTE — TOC Progression Note (Addendum)
Transition of Care [facility]) - Progression Note    Patient Details  Name: Wayne Wood MRN: [id number] Date of Birth: 09-10-1956  Transition of Care [facility]) CM/SW Contact  Huston Foley Jacklynn Ganong, RN Phone Number: 08/02/2021, 5:29 PM  Clinical Narrative:  .Case manager has tried to contact patient's daughter and son, at his request to update them on his discharge plan. Patient wants to go home, [facility] services are to be provided by [facility] for RN,PT/OT/SW. daughter Cyndia Bent [phone], Crist Fat [phone] called,  Neither have answered the phone.  Left message that they can contact RN prior to dc if questions.     Expected Discharge Plan: Home w Home Health Services Barriers to Discharge: No Barriers Identified  Expected Discharge Plan and Services Expected Discharge Plan: Home w Home Health Services In-house Referral: NA Discharge Planning Services: CM Consult Post Acute Care Choice: Home Health Living arrangements for the past 2 months: Apartment Expected Discharge Date: 08/02/21               DME Arranged: Dan Humphreys rolling DME Agency: [employer] Date DME Agency Contacted: 08/02/21 Time DME Agency Contacted: 1200 Representative spoke with at DME Agency: Velna Hatchet HH Arranged: RN, Disease ManagementPT/OT/SW [facility] Agency: [employer] [facility] Date [facility] Agency Contacted: 08/02/21 Time HH Agency Contacted: [phone]     Social Determinants of Health (SDOH) Interventions    Readmission Risk Interventions No flowsheet data found.

## 2021-08-02 NOTE — Progress Notes (Signed)
Patient discharging home. Vital signs stable at time of discharge. Discharge instructions given and verbal understanding returned. Patient demonstrated how to use insulin pen and inject himself. No questions at this time. Patient discharging with a walker, and filled prescriptions.

## 2021-08-02 NOTE — Progress Notes (Signed)
Physical Therapy Treatment Patient Details Name: Wayne Wood MRN: [id number] DOB: May 27, 1956 Today's Date: 08/02/2021   History of Present Illness Pt is a 65 yo male who presented to [facility] ED due to altered mental status and suspected seizure thought to be from hyperglycemia. + left ankle pain; xray negative for acute injury  PMH includes essential hypertension, hyperlipidemia, GERD, type 2 diabetes, obesity.    PT Comments    Patient's pain level much improved s/p ankle injection. Able to ambulate 300 ft with RW modified independently and should have no trouble ascending steps to 3rd floor apartment (deferred practice but did educate on how to carry RW with him).  Discharge plan updated to home and does not even need HHPT.    Recommendations for follow up therapy are one component of a multi-disciplinary discharge planning process, led by the attending physician.  Recommendations may be updated based on patient status, additional functional criteria and insurance authorization.  Follow Up Recommendations  No PT follow up     Equipment Recommendations  Rolling walker with 5" wheels    Recommendations for Other Services       Precautions / Restrictions Precautions Precautions: Fall;Other (comment) Precaution Comments: h/o recent seizure Restrictions Weight Bearing Restrictions: No     Mobility  Bed Mobility Overal bed mobility: Independent Bed Mobility: Supine to Sit;Sit to Supine     Supine to sit: HOB elevated;Modified independent (Device/Increase time) Sit to supine: HOB elevated;Modified independent (Device/Increase time)        Transfers Overall transfer level: Needs assistance Equipment used: Rolling walker (2 wheeled) Transfers: Sit to/from Stand Sit to Stand: Supervision         General transfer comment: from regular height bed; vc for hand placement during sit to stand and return to sit  Ambulation/Gait Ambulation/Gait assistance: Modified  independent (Device/Increase time) Gait Distance (Feet): 300 Feet Assistive device: Rolling walker (2 wheeled) Gait Pattern/deviations: Step-through pattern;Trunk flexed;Antalgic   Gait velocity interpretation: >2.62 ft/sec, indicative of community ambulatory General Gait Details: limited reliance on UEs for balance due to some pain with walking   Stairs Stairs:  (pt deferred; he now has someone taking him home that will carry RW up the steps for him; shown how to fold walker and carry in hand opposite the rail)           Wheelchair Mobility    Modified Rankin (Stroke Patients Only)       Balance Overall balance assessment: Needs assistance Sitting-balance support: No upper extremity supported;Feet supported Sitting balance-Leahy Scale: Good Sitting balance - Comments: BUE support to maintain static sitting   Standing balance support: Bilateral upper extremity supported;During functional activity Standing balance-Leahy Scale: Poor Standing balance comment: reliant on UE support of walker                            Cognition Arousal/Alertness: Awake/alert Behavior During Therapy: WFL for tasks assessed/performed Overall Cognitive Status: Within Functional Limits for tasks assessed                                        Exercises      General Comments        Pertinent Vitals/Pain Pain Assessment: Faces Faces Pain Scale: Hurts a little bit Pain Location: arches from walking without shoes Pain Descriptors / Indicators: Sore Pain Intervention(s): Limited activity within  patient's tolerance    Home Living                      Prior Function            PT Goals (current goals can now be found in the care plan section) Acute Rehab PT Goals Patient Stated Goal: home PT Goal Formulation: With patient Time For Goal Achievement: 08/10/21 Potential to Achieve Goals: Good Progress towards PT goals: Goals met/education  completed, patient discharged from PT    Frequency    Min 3X/week      PT Plan Discharge plan needs to be updated    Co-evaluation              AM-PAC PT "6 Clicks" Mobility   Outcome Measure  Help needed turning from your back to your side while in a flat bed without using bedrails?: None Help needed moving from lying on your back to sitting on the side of a flat bed without using bedrails?: None Help needed moving to and from a bed to a chair (including a wheelchair)?: None Help needed standing up from a chair using your arms (e.g., wheelchair or bedside chair)?: A Little Help needed to walk in hospital room?: None Help needed climbing 3-5 steps with a railing? : None 6 Click Score: 23    End of Session   Activity Tolerance: Patient tolerated treatment well Patient left: in bed;with call bell/phone within reach Nurse Communication: Mobility status;Other (comment) (OK for d/c; needs RW prior to dc) PT Visit Diagnosis: Difficulty in walking, not elsewhere classified (R26.2);Pain Pain - Right/Left: Left Pain - part of body: Ankle and joints of foot     Time: 0122-2411 PT Time Calculation (min) (ACUTE ONLY): 13 min  Charges:  $Gait Training: 8-22 mins                      Arby Barrette, PT Pager [phone]    Rexanne Mano 08/02/2021, 12:03 PM

## 2021-08-02 NOTE — Progress Notes (Signed)
Left ankle pain has significantly improved-he apparently ambulated up and down the hallway yesterday quite a few times.  He wants to go home-he will be discharged home with home health services.  He claims he has 24/7 care at home-and he will have someone that will help him inject insulin as well.  Have asked RN to provide diabetic education/insulin administration teaching before discharge.

## 2021-08-04 LAB — GLUCOSE, CAPILLARY: Glucose-Capillary: >600 mg/dL (ref 70–99)

## 2021-08-30 ENCOUNTER — Encounter: Payer: Self-pay | Admitting: Neurology

## 2021-08-30 ENCOUNTER — Other Ambulatory Visit: Payer: Self-pay

## 2021-08-30 ENCOUNTER — Ambulatory Visit: Payer: Medicare HMO | Admitting: Neurology

## 2021-08-30 VITALS — BP 130/68 | HR 87 | Ht 72.0 in | Wt 222.0 lb

## 2021-08-30 DIAGNOSIS — E1169 Type 2 diabetes mellitus with other specified complication: Secondary | ICD-10-CM

## 2021-08-30 DIAGNOSIS — R569 Unspecified convulsions: Secondary | ICD-10-CM | POA: Diagnosis not present

## 2021-08-30 DIAGNOSIS — Z794 Long term (current) use of insulin: Secondary | ICD-10-CM
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
## 2021-08-30 NOTE — Patient Instructions (Signed)
Continue current medications  Return to clinic in 6 months    Per [facility] statutes, patients with seizures are not allowed to drive until they have been seizure-free for six months.  Other recommendations include using caution when using heavy equipment or power tools. Avoid working on ladders or at heights. Take showers instead of baths.  Do not swim alone.  Ensure the water temperature is not too high on the home water heater. Do not go swimming alone. Do not lock yourself in a room alone (i.e. bathroom). When caring for infants or small children, sit down when holding, feeding, or changing them to minimize risk of injury to the child in the event you have a seizure. Maintain good sleep hygiene. Avoid alcohol.  Also recommend adequate sleep, hydration, good diet and minimize stress.   During the Seizure  - First, ensure adequate ventilation and place patients on the floor on their left side  Loosen clothing around the neck and ensure the airway is patent. If the patient is clenching the teeth, do not force the mouth open with any object as this can cause severe damage - Remove all items from the surrounding that can be hazardous. The patient may be oblivious to what's happening and may not even know what he or she is doing. If the patient is confused and wandering, either gently guide him/her away and block access to outside areas - Reassure the individual and be comforting - Call 911. In most cases, the seizure ends before EMS arrives. However, there are cases when seizures may last over 3 to 5 minutes. Or the individual may have developed breathing difficulties or severe injuries. If a pregnant patient or a person with diabetes develops a seizure, it is prudent to call an ambulance. - Finally, if the patient does not regain full consciousness, then call EMS. Most patients will remain confused for about 45 to 90 minutes after a seizure, so you must use judgment in calling for help. -  Avoid restraints but make sure the patient is in a bed with padded side rails - Place the individual in a lateral position with the neck slightly flexed; this will help the saliva drain from the mouth and prevent the tongue from falling backward - Remove all nearby furniture and other hazards from the area - Provide verbal assurance as the individual is regaining consciousness - Provide the patient with privacy if possible - Call for help and start treatment as ordered by the caregiver   After the Seizure (Postictal Stage)  After a seizure, most patients experience confusion, fatigue, muscle pain and/or a headache. Thus, one should permit the individual to sleep. For the next few days, reassurance is essential. Being calm and helping reorient the person is also of importance.  Most seizures are painless and end spontaneously. Seizures are not harmful to others but can lead to complications such as stress on the lungs, brain and the heart. Individuals with prior lung problems may develop labored breathing and respiratory distress.

## 2021-09-30 ENCOUNTER — Other Ambulatory Visit (HOSPITAL_COMMUNITY): Payer: Self-pay

## 2021-12-20 ENCOUNTER — Other Ambulatory Visit (HOSPITAL_COMMUNITY): Payer: Self-pay

## 2022-02-14 ENCOUNTER — Ambulatory Visit: Payer: Medicare HMO | Admitting: Neurology

## 2022-02-14 ENCOUNTER — Encounter: Payer: Self-pay | Admitting: Neurology

## 2022-02-14 VITALS — BP 127/76 | HR 83 | Ht 72.0 in | Wt 207.5 lb

## 2022-02-14 DIAGNOSIS — R569 Unspecified convulsions: Secondary | ICD-10-CM

## 2022-02-14 DIAGNOSIS — Z794 Long term (current) use of insulin: Secondary | ICD-10-CM | POA: Diagnosis not present

## 2022-02-14 DIAGNOSIS — E1169 Type 2 diabetes mellitus with other specified complication: Secondary | ICD-10-CM

## 2022-02-14 NOTE — Patient Instructions (Signed)
Follow up with your primary care  ?Return as needed  ?
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
## 2022-02-21 ENCOUNTER — Ambulatory Visit: Payer: Medicare HMO | Admitting: Neurology

## 2022-06-07 ENCOUNTER — Other Ambulatory Visit (HOSPITAL_COMMUNITY): Payer: Self-pay

## 2023-03-06 DIAGNOSIS — H2513 Age-related nuclear cataract, bilateral: Secondary | ICD-10-CM | POA: Diagnosis not present

## 2023-03-06 DIAGNOSIS — Z135 Encounter for screening for eye and ear disorders: Secondary | ICD-10-CM | POA: Diagnosis not present

## 2023-03-06 DIAGNOSIS — H524 Presbyopia: Secondary | ICD-10-CM | POA: Diagnosis not present

## 2023-08-08 IMAGING — CT CT ANKLE*L* W/O CM
3 series · 16 of 33 positions shown, 19 images · non-contrast
Comparison: None.

CLINICAL DATA: Severe ankle pain status post fall.

EXAM:
CT OF THE LEFT ANKLE WITHOUT CONTRAST
TECHNIQUE: Multidetector CT imaging of the left ankle was performed according
to the standard protocol. Multiplanar CT image reconstructions were
also generated.

[Series 4: extremity soft tissue · axial · 0.44mm/px · z∈[+35,+203]mm · 8 of 100 slices shown, 10 images]
[im 8/100  soft-tissue]
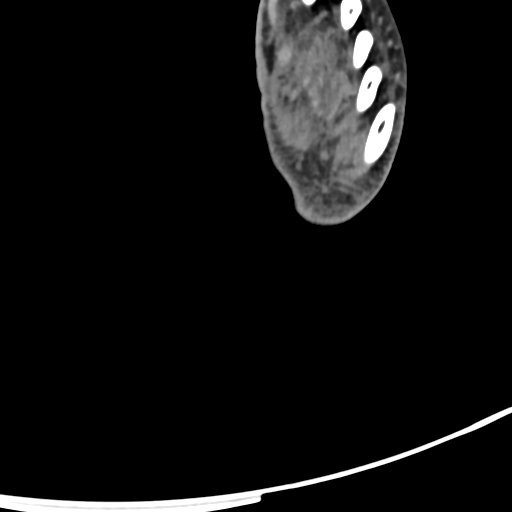
[im 8/100  bone]
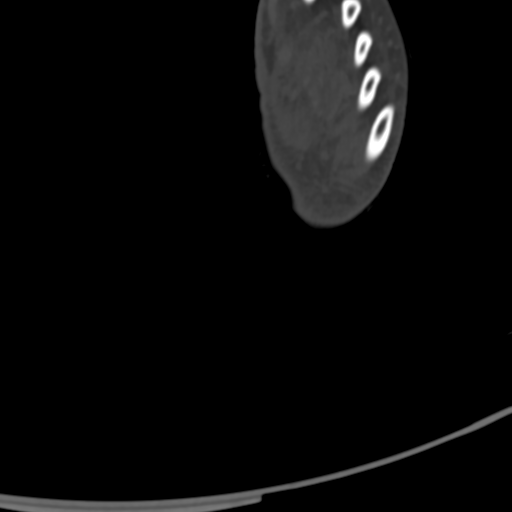
[im 23/100  bone]
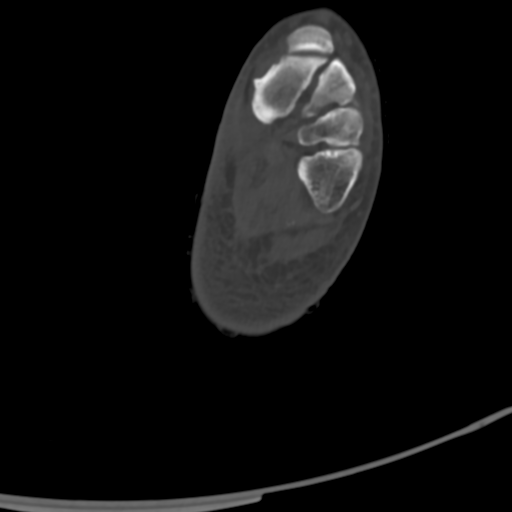
[im 31/100  bone]
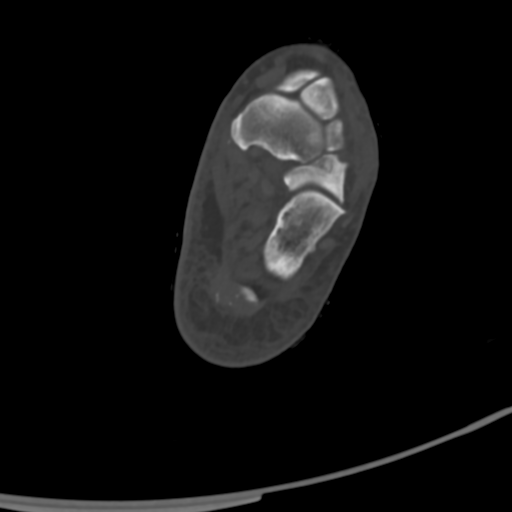
[im 46/100  bone]
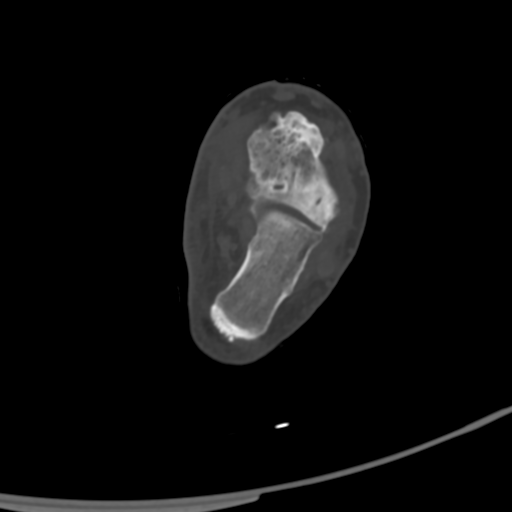
[im 54/100  soft-tissue]
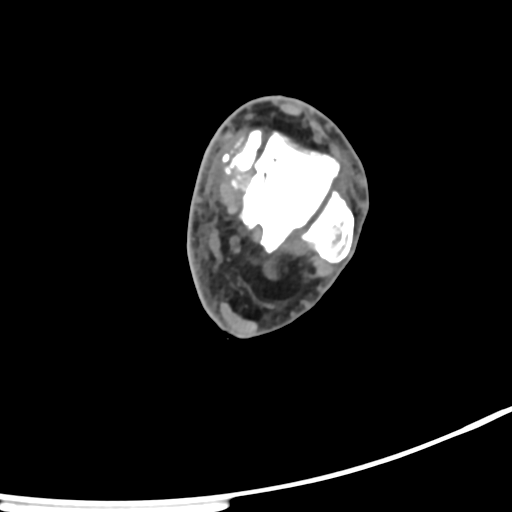
[im 54/100  bone]
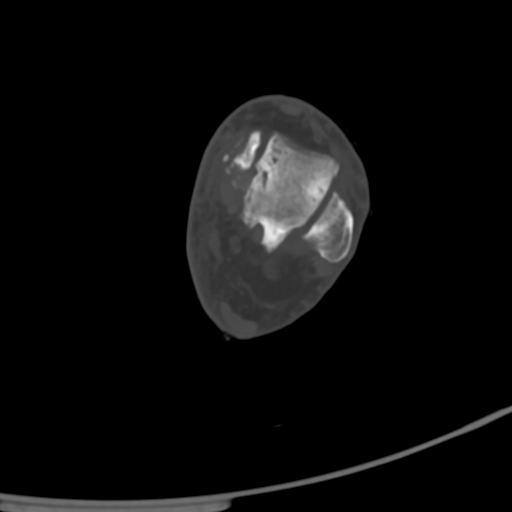
[im 69/100  bone]
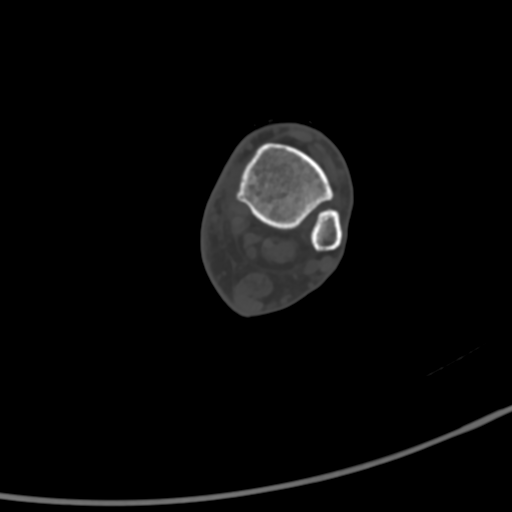
[im 77/100  bone]
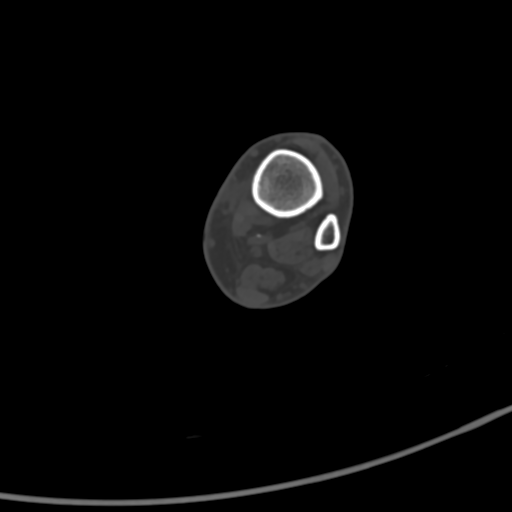
[im 92/100  bone]
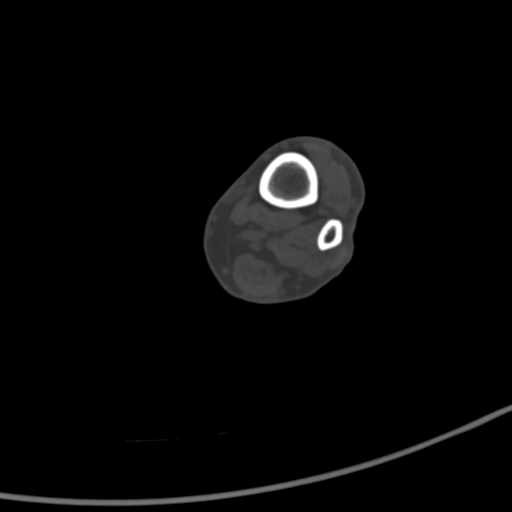

[Series 8: cor soft tissue · coronal · 0.38mm/px · 3 of 87 slices shown]
[im 18/87  bone]
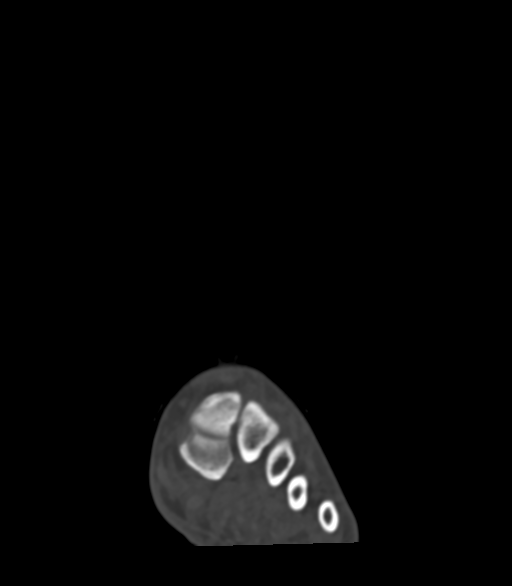
[im 35/87  bone]
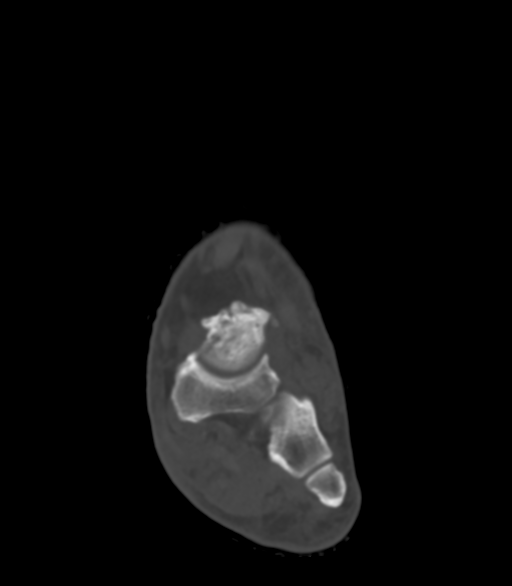
[im 52/87  bone]
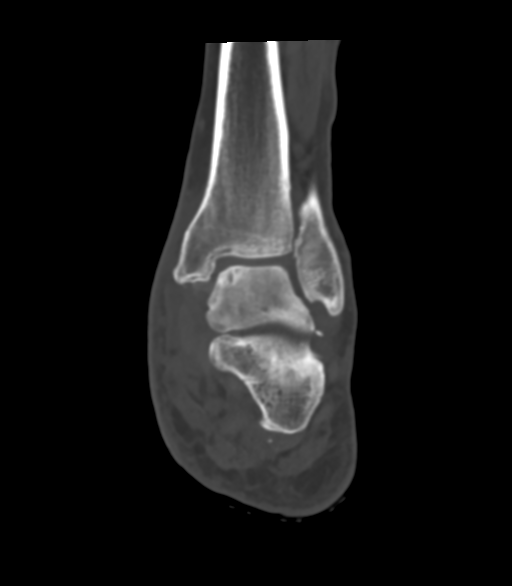

[Series 9: sag soft tissue · sagittal · 0.42mm/px · 5 of 43 slices shown, 6 images]
[im 15/43  bone]
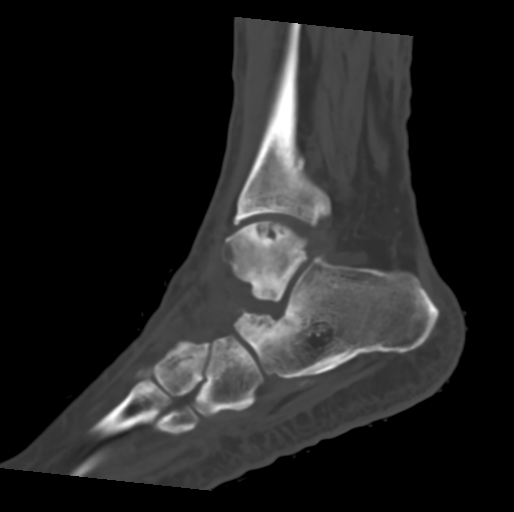
[im 18/43  bone]
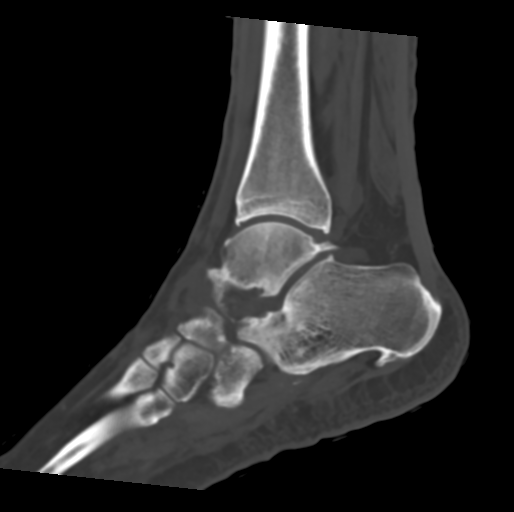
[im 22/43  soft-tissue]
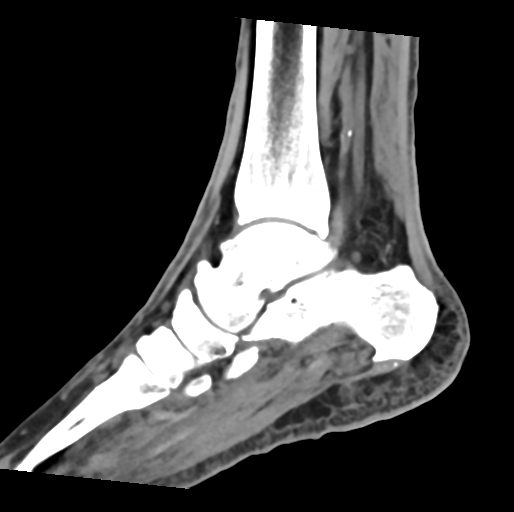
[im 22/43  bone]
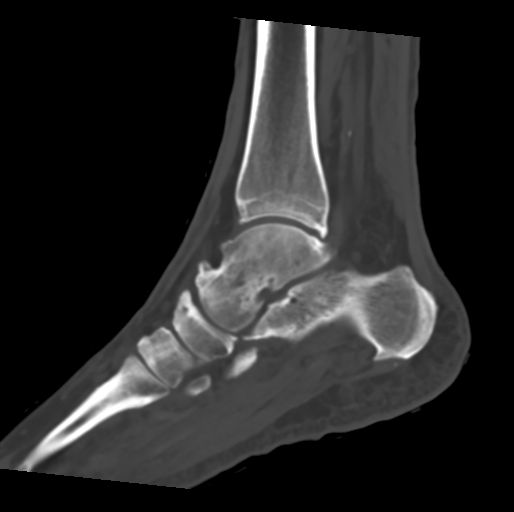
[im 25/43  bone]
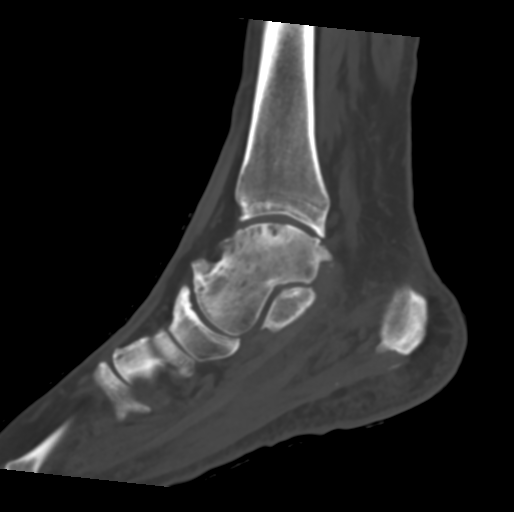
[im 29/43  bone]
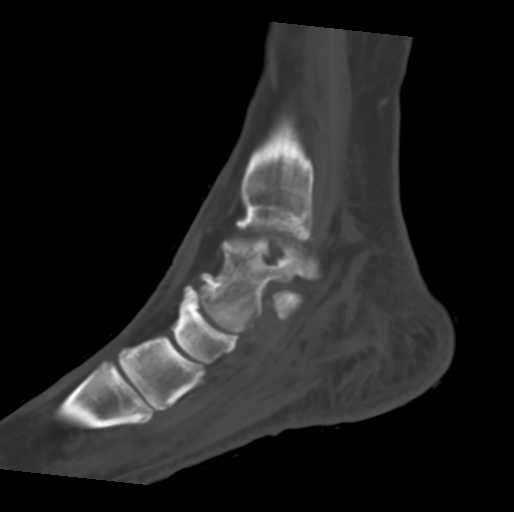

[16 of 33 positions shown; findings below may reference images not displayed]

FINDINGS: Bones/Joint/Cartilage

No fracture or dislocation. Normal alignment. No joint effusion.
Mild osteoarthritis of the tibiotalar joint.

Osteochondral lesions involving the medial and lateral corner of the
talar dome.

Old ununited avulsive injury of the medial malleolus.

Mild osteoarthritis of the subtalar joints. Moderate osteoarthritis
of the talonavicular joint.

Small plantar calcaneal spur.

Ligaments

Ligaments are suboptimally evaluated by CT.

Muscles and Tendons
Muscles are normal. No muscle atrophy. No intramuscular fluid
collection or hematoma. Flexor, extensor, peroneal and Achilles
tendons are intact.

Soft tissue
No fluid collection or hematoma. No soft tissue mass. Small tissue
wounds involving the medial aspect of the ankle with edema in the
subcutaneous fat concerning for cellulitis.
IMPRESSION: 1.  No acute osseous injury of the left ankle.
2. Mild osteoarthritis of the tibiotalar joint.
3. Osteochondral lesions involving the medial and lateral corner of
the talar dome.
4. Mild osteoarthritis of the subtalar joints. Moderate
osteoarthritis of the talonavicular joint.
5. Small tissue wounds involving the medial aspect of the ankle with
edema in the subcutaneous fat concerning for cellulitis.

## 2024-07-23 ENCOUNTER — Other Ambulatory Visit (HOSPITAL_COMMUNITY): Payer: Self-pay

## 2024-09-02 ENCOUNTER — Encounter: Payer: Self-pay | Admitting: Podiatry

## 2024-09-02 ENCOUNTER — Ambulatory Visit: Admitting: Podiatry

## 2024-09-02 DIAGNOSIS — L600 Ingrowing nail: Secondary | ICD-10-CM | POA: Diagnosis not present

## 2024-09-02 DIAGNOSIS — B351 Tinea unguium: Secondary | ICD-10-CM | POA: Diagnosis not present

## 2024-09-02 NOTE — Progress Notes (Signed)
 Subjective:   Patient ID: Wayne Wood, male   DOB: 68 y.o.   MRN: [id number]   HPI Patient presents with severe nail disease with thickness and pain big toenails both feet with inability to wear shoe gear without difficulty.  States trimming has been tried he did have nail removal about 5 years ago but not successful and needs this to be done.  Patient does not smoke likes to be active   Review of Systems  All other systems reviewed and are negative.       Objective:  Physical Exam Vitals and nursing note reviewed.  Constitutional:      Appearance: He is well-developed.  Pulmonary:     Effort: Pulmonary effort is normal.  Musculoskeletal:        General: Normal range of motion.  Skin:    General: Skin is warm.  Neurological:     Mental Status: He is alert.     Neurovascular status found to be intact muscle strength adequate range of motion within normal limits.  Patient is found to have severe thickness of the hallux nails both feet with dystrophic changes painful when pressed inability to wear shoe gear without discomfort.  Patient has good digital perfusion well-oriented x 3     Assessment:  Damaged thickened big toenails of both feet that are impossible to cut and painful     Plan:  H&P reviewed treatment options and he has opted to have them corrected permanently.  I did explain procedures and risk and allowed him to read then signed consent form after review.  I infiltrated each big toe 60 mg Xylocaine  Marcaine  mixture sterile prep done using sterile instrumentation removed the hallux nails exposed matrix and applied phenol 5 applications 30 seconds followed by alcohol lavage sterile dressing gave instructions on soaks wear dressings 24 hours take them off earlier if throbbing were to occur and encouraged to call questions concerns during healing

## 2024-09-02 NOTE — Patient Instructions (Signed)

## 2024-09-18 ENCOUNTER — Encounter: Payer: Self-pay | Admitting: Podiatry

## 2024-09-18 ENCOUNTER — Ambulatory Visit: Admitting: Podiatry

## 2024-09-18 DIAGNOSIS — L600 Ingrowing nail: Secondary | ICD-10-CM | POA: Diagnosis not present

## 2024-09-19 NOTE — Progress Notes (Signed)
 Subjective:   Patient ID: Wayne Wood, male   DOB: 68 y.o.   MRN: [id number]   HPI Patient presents concerned about nail condition and whether or not he has infection after removal of his hallux nails neuro   ROS      Objective:  Physical Exam  Vascular status intact patient's areas are drying out there is some proximal darkness which is more dried blood with no erythema edema noted     Assessment:  Overall doing well post nail removal bilateral hallux     Plan:  Reviewed condition and recommended soaks for the area to be continued bandage usage but I do think uneventful healing of ingrown toenail is occurring and explained that to him
